# Patient Record
Sex: Male | Born: 1935 | Race: White | Hispanic: No | Marital: Married | Smoking: Former smoker
Health system: Southern US, Community
[De-identification: ages and names within clinical notes are randomized; demographics above are authoritative.]

## PROBLEM LIST (undated history)

## (undated) DIAGNOSIS — J189 Pneumonia, unspecified organism: Secondary | ICD-10-CM

## (undated) DIAGNOSIS — I10 Essential (primary) hypertension: Secondary | ICD-10-CM

## (undated) DIAGNOSIS — I2510 Atherosclerotic heart disease of native coronary artery without angina pectoris: Secondary | ICD-10-CM

## (undated) DIAGNOSIS — I509 Heart failure, unspecified: Secondary | ICD-10-CM

## (undated) DIAGNOSIS — N289 Disorder of kidney and ureter, unspecified: Secondary | ICD-10-CM

## (undated) DIAGNOSIS — E7800 Pure hypercholesterolemia, unspecified: Secondary | ICD-10-CM

## (undated) DIAGNOSIS — K2270 Barrett's esophagus without dysplasia: Secondary | ICD-10-CM

## (undated) DIAGNOSIS — I219 Acute myocardial infarction, unspecified: Secondary | ICD-10-CM

## (undated) HISTORY — PX: CORONARY ANGIOPLASTY: SHX604

## (undated) HISTORY — PX: HERNIA REPAIR: SHX51

---

## 1998-02-03 ENCOUNTER — Inpatient Hospital Stay (HOSPITAL_COMMUNITY): Admission: EM | Admit: 1998-02-03 | Discharge: 1998-02-04 | Payer: Self-pay | Admitting: Emergency Medicine

## 1998-08-06 ENCOUNTER — Emergency Department (HOSPITAL_COMMUNITY): Admission: EM | Admit: 1998-08-06 | Discharge: 1998-08-06 | Payer: Self-pay | Admitting: Emergency Medicine

## 1998-08-06 ENCOUNTER — Encounter: Payer: Self-pay | Admitting: Emergency Medicine

## 2003-07-31 ENCOUNTER — Ambulatory Visit (HOSPITAL_COMMUNITY): Admission: RE | Admit: 2003-07-31 | Discharge: 2003-07-31 | Payer: Self-pay | Admitting: *Deleted

## 2003-07-31 ENCOUNTER — Encounter (INDEPENDENT_AMBULATORY_CARE_PROVIDER_SITE_OTHER): Payer: Self-pay | Admitting: Specialist

## 2004-11-07 ENCOUNTER — Ambulatory Visit (HOSPITAL_COMMUNITY): Admission: RE | Admit: 2004-11-07 | Discharge: 2004-11-07 | Payer: Self-pay | Admitting: *Deleted

## 2004-11-07 ENCOUNTER — Encounter (INDEPENDENT_AMBULATORY_CARE_PROVIDER_SITE_OTHER): Payer: Self-pay | Admitting: Specialist

## 2005-08-26 ENCOUNTER — Encounter: Payer: Self-pay | Admitting: Cardiovascular Disease

## 2006-12-31 ENCOUNTER — Encounter: Admission: RE | Admit: 2006-12-31 | Discharge: 2006-12-31 | Payer: Self-pay | Admitting: Internal Medicine

## 2007-03-01 ENCOUNTER — Encounter (INDEPENDENT_AMBULATORY_CARE_PROVIDER_SITE_OTHER): Payer: Self-pay | Admitting: *Deleted

## 2007-03-01 ENCOUNTER — Ambulatory Visit (HOSPITAL_COMMUNITY): Admission: RE | Admit: 2007-03-01 | Discharge: 2007-03-01 | Payer: Self-pay | Admitting: *Deleted

## 2010-09-09 NOTE — Op Note (Signed)
NAMERIKKI, SMESTAD NO.:  [id number]   MEDICAL RECORD NO.:  [id number]          PATIENT TYPE:  AMB   LOCATION:  ENDO                         FACILITY:  [facility]   PHYSICIAN:  Georgiana Spinner, M.D.    DATE OF BIRTH:  [birth year]   DATE OF PROCEDURE:  03/01/2007  DATE OF DISCHARGE:                               OPERATIVE REPORT   PROCEDURE:  Upper endoscopy.   INDICATIONS:  Gastroesophageal reflux disease with a question of  Barrett's esophagus.   ANESTHESIA:  Fentanyl 50 mcg, Versed 4 mg.   DESCRIPTION OF PROCEDURE:  With the patient mildly sedated in the left  lateral decubitus position, the Pentax videoscopic endoscope was  inserted in the mouth and passed under direct vision through the  esophagus which appeared normal until we reached the distal esophagus  and there appeared to be an isthmus of Barrett's photographed and  biopsied.  We entered into the stomach.  The fundus and body appeared  normal.  The prepyloric area, however, showed changes of erythema,  probably a localized gastritis which was photographed and again  biopsied.  We entered into the duodenal bulb and second portion of the  duodenum, both appeared normal.  From this point, the endoscope was  slowly withdrawn taking circumferential views of the duodenal mucosa  until the endoscope had been pulled back into stomach and placed in  retroflexion to view the stomach from below. The endoscope was  straightened and withdrawn taking circumferential views of the remaining  gastric and esophageal mucosa.  The patient's vital signs and pulse  oximeter remained stable.  The patient tolerated the procedure well  without apparent complications   FINDINGS:  Erythematous prepyloric area biopsied and question of  Barrett's esophagus biopsied.  Await biopsy report.  The patient will  call me for results and follow up with me as an outpatient.           ______________________________  Georgiana Spinner,  M.D.     GMO/MEDQ  D:  03/01/2007  T:  03/01/2007  Job:  [id number]

## 2010-09-12 NOTE — Op Note (Signed)
NAMEDREVON, PLOG NO.:  [id number]   MEDICAL RECORD NO.:  [id number]          PATIENT TYPE:  AMB   LOCATION:  ENDO                         FACILITY:  [facility]   PHYSICIAN:  Georgiana Spinner, M.D.    DATE OF BIRTH:  March 01, 1929   DATE OF PROCEDURE:  11/07/2004  DATE OF DISCHARGE:                                 OPERATIVE REPORT   PROCEDURE:  Upper endoscopy with biopsy.   INDICATIONS:  Barrett's esophagus by biopsy in the past.   ANESTHESIA:  Demerol 40, Versed 4 mg.   DESCRIPTION OF PROCEDURE:  With the patient mildly sedated in the left  lateral decubitus position, the Olympus videoscopic endoscope was inserted  in the mouth and passed under direct vision into the esophagus which  appeared normal until we reached the distal esophagus and it appeared he had  a rolled stricture of the distal esophagus. This was photographed and he had  an area of Barrett's photographed as well and biopsied. We entered into the  stomach. The fundus, body, antrum, duodenal bulb, second portion of duodenum  all appeared normal. From this point, the endoscope was slowly withdrawn  taking circumferential views of the duodenal mucosa until the endoscope was  then pulled back in the stomach, placed in retroflexion to view the stomach  from below. The endoscope was straightened and withdrawn taking  circumferential views of the remaining gastric and esophageal mucosa  stopping in the antrum to biopsy an erythematous area. The patient's vital  signs, pulse oximeter remained stable. The patient tolerated procedure well  without apparent complications.   FINDINGS:  Erythema of the antrum biopsied, incomplete wrap of the GE  junction around the endoscope indicating laxity of the gastroesophageal  sphincter, Barrett's esophagus and question of early stricture.   PLAN:  Await biopsy report. The patient will call me for results and follow-  up with me as an outpatient       GMO/MEDQ  D:   11/07/2004  T:  11/07/2004  Job:  [id number]

## 2010-09-12 NOTE — Op Note (Signed)
NAME:  Kirk Townsend, OU NO.:  [id number]   MEDICAL RECORD NO.:  [id number]                   PATIENT TYPE:  AMB   LOCATION:  ENDO                                 FACILITY:  [facility]   PHYSICIAN:  Georgiana Spinner, M.D.                 DATE OF BIRTH:  [birth year]   DATE OF PROCEDURE:  07/31/2003  DATE OF DISCHARGE:                                 OPERATIVE REPORT   PROCEDURE PERFORMED:  Colonoscopy.   ENDOSCOPIST:  Georgiana Spinner, M.D.   INDICATIONS FOR PROCEDURE:  Colon cancer screening.   ANESTHESIA:  None further given.   DESCRIPTION OF PROCEDURE:  With the patient mildly sedated in the left  lateral decubitus position, the Olympus video colonoscope was inserted in  the rectum and passed under direct vision to the cecum, identified by the  ileocecal valve and appendiceal orifice, both of which were photographed.  From this point the colonoscope was slowly withdrawn taking circumferential  views of the colonic mucosa stopping in the rectum which appeared normal on  direct and retroflex view.  The endoscope was straightened and withdrawn.  The patient's vital signs and pulse oximeter remained stable.  The patient  tolerated the procedure well without apparent complications.   FINDINGS:  Unremarkable examination.   PLAN:  See endoscopy note for further details.                                               Georgiana Spinner, M.D.    GMO/MEDQ  D:  07/31/2003  T:  07/31/2003  Job:  [id number]

## 2010-09-12 NOTE — Op Note (Signed)
NAME:  Kirk Townsend, Kirk Townsend NO.:  [id number]   MEDICAL RECORD NO.:  [id number]                   PATIENT TYPE:  AMB   LOCATION:  ENDO                                 FACILITY:  [facility]   PHYSICIAN:  Georgiana Spinner, M.D.                 DATE OF BIRTH:  [birth year]   DATE OF PROCEDURE:  07/31/2003  DATE OF DISCHARGE:                                 OPERATIVE REPORT   PROCEDURE:  Upper endoscopy.   INDICATIONS FOR PROCEDURE:  GERD.   ANESTHESIA:  Demerol 40, Versed 4 mg.   PROCEDURE:  With the patient mildly sedated in the left lateral decubitus  position, the Olympus videoscopic endoscope was inserted in the mouth and  passed under direct vision through the esophagus.  I did not see evidence of  Barrett's although the distal esophagus was somewhat inflamed.  This was  photographed and subsequently biopsied.  We entered into the stomach.  The  fundus, body, antrum, duodenal bulb, and second portion of the duodenum were  visualized.  From this point, the endoscope was slowly withdrawn taking  circumferential views of the duodenal mucosa until the endoscope was pulled  back into the stomach and placed in retroflexion to view the stomach from  below and a loose wrap of the GE junction was seen and photographed.  The  endoscope was then straightened and withdrawn taking circumferential views  of the remaining gastric and esophageal mucosa stopping at the body of the  stomach which appeared mottled and was photographed and biopsied.  The  endoscope was then withdrawn.  The patient's vital signs and pulse oximetry  remained stable.  The patient tolerated the procedure well without apparent  complications.   FINDINGS:  Changes of the distal esophagus, biopsied, and biopsy of the  stomach body.   PLAN:  Await biopsy reports, patient will call me for results and follow up  as an outpatient.  Proceed with colonoscopy as planned.             Georgiana Spinner, M.D.    GMO/MEDQ  D:  07/31/2003  T:  07/31/2003  Job:  [id number]

## 2011-05-26 DIAGNOSIS — I10 Essential (primary) hypertension: Secondary | ICD-10-CM | POA: Diagnosis not present

## 2011-05-26 DIAGNOSIS — N289 Disorder of kidney and ureter, unspecified: Secondary | ICD-10-CM | POA: Diagnosis not present

## 2011-06-02 DIAGNOSIS — G47419 Narcolepsy without cataplexy: Secondary | ICD-10-CM | POA: Diagnosis not present

## 2011-06-02 DIAGNOSIS — G4710 Hypersomnia, unspecified: Secondary | ICD-10-CM | POA: Diagnosis not present

## 2011-06-02 DIAGNOSIS — I6529 Occlusion and stenosis of unspecified carotid artery: Secondary | ICD-10-CM | POA: Diagnosis not present

## 2011-06-02 DIAGNOSIS — H4900 Third [oculomotor] nerve palsy, unspecified eye: Secondary | ICD-10-CM | POA: Diagnosis not present

## 2011-06-19 DIAGNOSIS — N2581 Secondary hyperparathyroidism of renal origin: Secondary | ICD-10-CM | POA: Diagnosis not present

## 2011-06-19 DIAGNOSIS — D649 Anemia, unspecified: Secondary | ICD-10-CM | POA: Diagnosis not present

## 2011-06-19 DIAGNOSIS — I129 Hypertensive chronic kidney disease with stage 1 through stage 4 chronic kidney disease, or unspecified chronic kidney disease: Secondary | ICD-10-CM | POA: Diagnosis not present

## 2011-06-19 DIAGNOSIS — N183 Chronic kidney disease, stage 3 unspecified: Secondary | ICD-10-CM | POA: Diagnosis not present

## 2011-06-24 DIAGNOSIS — I10 Essential (primary) hypertension: Secondary | ICD-10-CM | POA: Diagnosis not present

## 2011-07-06 DIAGNOSIS — I10 Essential (primary) hypertension: Secondary | ICD-10-CM | POA: Diagnosis not present

## 2011-08-05 DIAGNOSIS — I10 Essential (primary) hypertension: Secondary | ICD-10-CM | POA: Diagnosis not present

## 2011-08-05 DIAGNOSIS — N289 Disorder of kidney and ureter, unspecified: Secondary | ICD-10-CM | POA: Diagnosis not present

## 2011-08-14 DIAGNOSIS — N179 Acute kidney failure, unspecified: Secondary | ICD-10-CM | POA: Diagnosis not present

## 2011-08-19 DIAGNOSIS — I129 Hypertensive chronic kidney disease with stage 1 through stage 4 chronic kidney disease, or unspecified chronic kidney disease: Secondary | ICD-10-CM | POA: Diagnosis not present

## 2011-08-19 DIAGNOSIS — R609 Edema, unspecified: Secondary | ICD-10-CM | POA: Diagnosis not present

## 2011-08-20 DIAGNOSIS — N289 Disorder of kidney and ureter, unspecified: Secondary | ICD-10-CM | POA: Diagnosis not present

## 2011-08-24 DIAGNOSIS — N390 Urinary tract infection, site not specified: Secondary | ICD-10-CM | POA: Diagnosis not present

## 2011-08-24 DIAGNOSIS — I10 Essential (primary) hypertension: Secondary | ICD-10-CM | POA: Diagnosis not present

## 2011-08-24 DIAGNOSIS — R609 Edema, unspecified: Secondary | ICD-10-CM | POA: Diagnosis not present

## 2011-09-01 ENCOUNTER — Emergency Department (HOSPITAL_COMMUNITY): Payer: Medicare Other

## 2011-09-01 ENCOUNTER — Encounter (HOSPITAL_COMMUNITY): Payer: Self-pay

## 2011-09-01 ENCOUNTER — Inpatient Hospital Stay (HOSPITAL_COMMUNITY)
Admission: EM | Admit: 2011-09-01 | Discharge: 2011-09-03 | DRG: 293 | Disposition: A | Discharge status: Home / Self Care | Payer: Medicare Other | Attending: Internal Medicine | Admitting: Internal Medicine

## 2011-09-01 DIAGNOSIS — I10 Essential (primary) hypertension: Secondary | ICD-10-CM | POA: Diagnosis present

## 2011-09-01 DIAGNOSIS — R609 Edema, unspecified: Secondary | ICD-10-CM | POA: Diagnosis not present

## 2011-09-01 DIAGNOSIS — J90 Pleural effusion, not elsewhere classified: Secondary | ICD-10-CM | POA: Diagnosis not present

## 2011-09-01 DIAGNOSIS — R0602 Shortness of breath: Secondary | ICD-10-CM | POA: Diagnosis not present

## 2011-09-01 DIAGNOSIS — M25579 Pain in unspecified ankle and joints of unspecified foot: Secondary | ICD-10-CM | POA: Diagnosis not present

## 2011-09-01 DIAGNOSIS — I129 Hypertensive chronic kidney disease with stage 1 through stage 4 chronic kidney disease, or unspecified chronic kidney disease: Secondary | ICD-10-CM | POA: Diagnosis present

## 2011-09-01 DIAGNOSIS — N189 Chronic kidney disease, unspecified: Secondary | ICD-10-CM

## 2011-09-01 DIAGNOSIS — J449 Chronic obstructive pulmonary disease, unspecified: Secondary | ICD-10-CM | POA: Diagnosis not present

## 2011-09-01 DIAGNOSIS — R918 Other nonspecific abnormal finding of lung field: Secondary | ICD-10-CM | POA: Diagnosis not present

## 2011-09-01 DIAGNOSIS — G47419 Narcolepsy without cataplexy: Secondary | ICD-10-CM | POA: Diagnosis present

## 2011-09-01 DIAGNOSIS — I2510 Atherosclerotic heart disease of native coronary artery without angina pectoris: Secondary | ICD-10-CM | POA: Diagnosis present

## 2011-09-01 DIAGNOSIS — I5031 Acute diastolic (congestive) heart failure: Principal | ICD-10-CM | POA: Diagnosis present

## 2011-09-01 DIAGNOSIS — M79609 Pain in unspecified limb: Secondary | ICD-10-CM | POA: Diagnosis not present

## 2011-09-01 DIAGNOSIS — M19079 Primary osteoarthritis, unspecified ankle and foot: Secondary | ICD-10-CM | POA: Diagnosis not present

## 2011-09-01 DIAGNOSIS — I509 Heart failure, unspecified: Secondary | ICD-10-CM

## 2011-09-01 DIAGNOSIS — J438 Other emphysema: Secondary | ICD-10-CM | POA: Diagnosis not present

## 2011-09-01 DIAGNOSIS — I501 Left ventricular failure: Secondary | ICD-10-CM | POA: Diagnosis not present

## 2011-09-01 DIAGNOSIS — R6889 Other general symptoms and signs: Secondary | ICD-10-CM | POA: Diagnosis not present

## 2011-09-01 DIAGNOSIS — R062 Wheezing: Secondary | ICD-10-CM | POA: Diagnosis not present

## 2011-09-01 DIAGNOSIS — M7989 Other specified soft tissue disorders: Secondary | ICD-10-CM | POA: Diagnosis not present

## 2011-09-01 HISTORY — DX: Atherosclerotic heart disease of native coronary artery without angina pectoris: I25.10

## 2011-09-01 HISTORY — DX: Essential (primary) hypertension: I10

## 2011-09-01 HISTORY — DX: Pure hypercholesterolemia, unspecified: E78.00

## 2011-09-01 HISTORY — DX: Acute myocardial infarction, unspecified: I21.9

## 2011-09-01 HISTORY — DX: Barrett's esophagus without dysplasia: K22.70

## 2011-09-01 HISTORY — DX: Disorder of kidney and ureter, unspecified: N28.9

## 2011-09-01 LAB — BASIC METABOLIC PANEL
BUN: 18 mg/dL (ref 6–23)
CO2: 25 mEq/L (ref 19–32)
Chloride: 102 mEq/L (ref 96–112)
Creatinine, Ser: 1.44 mg/dL — ABNORMAL HIGH (ref 0.50–1.35)
Potassium: 4.1 mEq/L (ref 3.5–5.1)

## 2011-09-01 LAB — CBC
HCT: 41.2 % (ref 39.0–52.0)
MCV: 95.2 fL (ref 78.0–100.0)
Platelets: 252 10*3/uL (ref 150–400)
RBC: 4.33 MIL/uL (ref 4.22–5.81)
WBC: 8.5 10*3/uL (ref 4.0–10.5)

## 2011-09-01 LAB — TROPONIN I: Troponin I: <0.3 ng/mL (ref <0.30)

## 2011-09-01 LAB — URINALYSIS, ROUTINE W REFLEX MICROSCOPIC
Hgb urine dipstick: NEGATIVE
Leukocytes, UA: NEGATIVE
Nitrite: NEGATIVE
Protein, ur: NEGATIVE mg/dL
Specific Gravity, Urine: 1.014 (ref 1.005–1.030)
Urobilinogen, UA: 1 mg/dL (ref 0.0–1.0)

## 2011-09-01 LAB — POCT I-STAT TROPONIN I: Troponin i, poc: 0.01 ng/mL (ref 0.00–0.08)

## 2011-09-01 LAB — CARDIAC PANEL(CRET KIN+CKTOT+MB+TROPI)
CK, MB: 1.5 ng/mL (ref 0.3–4.0)
Total CK: 46 U/L (ref 7–232)

## 2011-09-01 LAB — HEPATIC FUNCTION PANEL
AST: 16 U/L (ref 0–37)
Bilirubin, Direct: 0.1 mg/dL (ref 0.0–0.3)
Indirect Bilirubin: 0.5 mg/dL (ref 0.3–0.9)
Total Bilirubin: 0.6 mg/dL (ref 0.3–1.2)

## 2011-09-01 MED ORDER — ACETAMINOPHEN 325 MG PO TABS: 650.0000 mg | ORAL_TABLET | ORAL | Status: DC | PRN

## 2011-09-01 MED ORDER — AMPHETAMINE-DEXTROAMPHETAMINE 10 MG PO TABS
10.0000 mg | ORAL_TABLET | Freq: Two times a day (BID) | ORAL | Status: DC
  Administered 2011-09-02 – 2011-09-03 (×2): 10 mg via ORAL
  Filled 2011-09-01 (×3): qty 1

## 2011-09-01 MED ORDER — ADULT MULTIVITAMIN W/MINERALS CH
1.0000 | ORAL_TABLET | Freq: Every day | ORAL | Status: DC
  Administered 2011-09-02 – 2011-09-03 (×2): 1 via ORAL
  Filled 2011-09-01 (×2): qty 1

## 2011-09-01 MED ORDER — SODIUM CHLORIDE 0.9 % IJ SOLN
3.0000 mL | Freq: Two times a day (BID) | INTRAMUSCULAR | Status: DC
  Administered 2011-09-01 – 2011-09-03 (×4): 3 mL via INTRAVENOUS

## 2011-09-01 MED ORDER — FUROSEMIDE 10 MG/ML IJ SOLN
40.0000 mg | Freq: Two times a day (BID) | INTRAMUSCULAR | Status: DC
  Administered 2011-09-02 – 2011-09-03 (×3): 40 mg via INTRAVENOUS
  Filled 2011-09-01 (×5): qty 4

## 2011-09-01 MED ORDER — FUROSEMIDE 10 MG/ML IJ SOLN
80.0000 mg | Freq: Once | INTRAMUSCULAR | Status: AC
  Administered 2011-09-01: 80 mg via INTRAVENOUS
  Filled 2011-09-01: qty 8

## 2011-09-01 MED ORDER — PANTOPRAZOLE SODIUM 40 MG PO TBEC
40.0000 mg | DELAYED_RELEASE_TABLET | Freq: Every day | ORAL | Status: DC
  Administered 2011-09-02 – 2011-09-03 (×2): 40 mg via ORAL
  Filled 2011-09-01 (×2): qty 1

## 2011-09-01 MED ORDER — ONDANSETRON HCL 4 MG/2ML IJ SOLN: 4.0000 mg | Freq: Four times a day (QID) | INTRAMUSCULAR | Status: DC | PRN

## 2011-09-01 MED ORDER — ENOXAPARIN SODIUM 40 MG/0.4ML ~~LOC~~ SOLN
40.0000 mg | SUBCUTANEOUS | Status: DC
  Administered 2011-09-01 – 2011-09-02 (×2): 40 mg via SUBCUTANEOUS
  Filled 2011-09-01 (×3): qty 0.4

## 2011-09-01 MED ORDER — ASPIRIN EC 81 MG PO TBEC
81.0000 mg | DELAYED_RELEASE_TABLET | Freq: Every day | ORAL | Status: DC
  Administered 2011-09-01 – 2011-09-03 (×3): 81 mg via ORAL
  Filled 2011-09-01 (×3): qty 1

## 2011-09-01 MED ORDER — POTASSIUM CHLORIDE CRYS ER 20 MEQ PO TBCR
20.0000 meq | EXTENDED_RELEASE_TABLET | Freq: Every day | ORAL | Status: DC
  Administered 2011-09-02 – 2011-09-03 (×2): 20 meq via ORAL
  Filled 2011-09-01 (×2): qty 1

## 2011-09-01 MED ORDER — SODIUM CHLORIDE 0.9 % IJ SOLN: 3.0000 mL | INTRAMUSCULAR | Status: DC | PRN

## 2011-09-01 MED ORDER — SIMVASTATIN 20 MG PO TABS
20.0000 mg | ORAL_TABLET | Freq: Every evening | ORAL | Status: DC
  Administered 2011-09-01 – 2011-09-02 (×2): 20 mg via ORAL
  Filled 2011-09-01 (×3): qty 1

## 2011-09-01 MED ORDER — BISOPROLOL FUMARATE 5 MG PO TABS
5.0000 mg | ORAL_TABLET | Freq: Every day | ORAL | Status: DC
  Administered 2011-09-02 – 2011-09-03 (×2): 5 mg via ORAL
  Filled 2011-09-01 (×2): qty 1

## 2011-09-01 MED ORDER — SODIUM CHLORIDE 0.9 % IV SOLN: 250.0000 mL | INTRAVENOUS | Status: DC | PRN

## 2011-09-01 MED ORDER — ARMODAFINIL 250 MG PO TABS: 250.0000 mg | ORAL_TABLET | Freq: Every day | ORAL | Status: DC

## 2011-09-01 NOTE — ED Provider Notes (Signed)
History     CSN: [id number]  Arrival date & time 09/01/11  1315   First MD Initiated Contact with Patient 09/01/11 1547      Chief Complaint  Patient presents with  . Shortness of Breath    (Consider location/radiation/quality/duration/timing/severity/associated sxs/prior treatment) HPI Patient is an 78 year old male with history of heart failure who presents today based on referral from his primary care physician, Dr. Rayfield Citizen.  Patient had his Lasix discontinued 2 days ago due to concerns over renal insufficiency. Patient has been receiving treatment with torsemide sent April 29. Patient has a history of coronary artery disease but last cardiac catheterization was in 1994. This was performed by Dr. Aleen Campi of [facility]. The patient has not seen a cardiologist since the mid 90s. He denies any chest pain. He does endorse bilateral lower extremity edema as well as dyspnea on exertion. Patient has JVD as well as bilateral lower extremity pitting edema. He is hemodynamically stable on presentation. Denies recent cough, sick contacts, or fever. Patient reports that his symptoms began when he discontinued his Lasix. He's had no chest pain throughout this.There are no other associated or modifying factors.  Past Medical History  Diagnosis Date  . Coronary artery disease   . Hypertension   . Renal disorder   . High cholesterol   . Narcolepsy   . Myocardial infarction     1994  . Barrett's esophagus     Past Surgical History  Procedure Date  . Hernia repair   . Coronary angioplasty     1994. No stent.    History reviewed. No pertinent family history.  History  Substance Use Topics  . Smoking status: Former Smoker -- 20 years    Quit date: 08/31/1956  . Smokeless tobacco: Never Used  . Alcohol Use: No      Review of Systems  Constitutional: Negative.   HENT: Negative.   Eyes: Negative.   Respiratory: Positive for shortness of breath.   Cardiovascular:  Positive for leg swelling.  Gastrointestinal: Negative.   Genitourinary: Negative.   Musculoskeletal: Negative.   Skin: Negative.   Neurological: Negative.   Hematological: Negative.   Psychiatric/Behavioral: Negative.   All other systems reviewed and are negative.    Allergies  Review of patient's allergies indicates no known allergies.  Home Medications   Current Outpatient Rx  Name Route Sig Dispense Refill  . AMLODIPINE BESYLATE 5 MG PO TABS Oral Take 5 mg by mouth daily.    . AMPHETAMINE-DEXTROAMPHETAMINE 10 MG PO TABS Oral Take 10 mg by mouth 2 (two) times daily.    . ARMODAFINIL 250 MG PO TABS Oral Take 250 mg by mouth daily.    . ASPIRIN 81 MG PO CHEW Oral Chew 81 mg by mouth daily.    Marland Kitchen BISOPROLOL FUMARATE 5 MG PO TABS Oral Take 5 mg by mouth daily.    . ADULT MULTIVITAMIN W/MINERALS CH Oral Take 1 tablet by mouth daily.    Marland Kitchen OMEPRAZOLE 20 MG PO CPDR Oral Take 20 mg by mouth daily.    Marland Kitchen SIMVASTATIN 20 MG PO TABS Oral Take 20 mg by mouth every evening.    Percell Boston PO Oral Take 4 capsules by mouth 2 (two) times daily.    . TORSEMIDE 5 MG PO TABS Oral Take 5 mg by mouth daily.    Marland Kitchen VITAMIN C 500 MG PO TABS Oral Take 500 mg by mouth daily.      BP 153/69  Pulse 65  Temp(Src) 98.9 F (37.2 C) (Oral)  Resp 16  SpO2 99%  Physical Exam  Nursing note and vitals reviewed. GEN: Well-developed, well-nourished male in no distress HEENT: Atraumatic, normocephalic. Oropharynx clear without erythema EYES: PERRLA BL, no scleral icterus. NECK: Trachea midline, no meningismus, JVD noted CV: regular rate and rhythm. No murmurs, rubs, or gallops PULM: No respiratory distress.  Diminished at the bases bilaterally. No crackles wheezes or rales. GI: soft, non-tender. No guarding, rebound, or tenderness. + bowel sounds  GU: deferred Neuro: cranial nerves 2-12 intact, no abnormalities of strength or sensation, A and O x 3 MSK: Patient moves all 4 extremities symmetrically, no  deformity,or injury noted. 2+ pitting edema bilaterally below the knees  Skin: No rashes petechiae, purpura, or jaundice Psych: no abnormality of mood   ED Course  Procedures (including critical care time)   Date: 09/01/2011  Rate: 63  Rhythm: normal sinus rhythm and premature ventricular contractions (PVC)  QRS Axis: normal  Intervals: normal  ST/T Wave abnormalities: normal  Conduction Disutrbances:none  Narrative Interpretation:   Old EKG Reviewed: unchanged   Labs Reviewed  BASIC METABOLIC PANEL - Abnormal; Notable for the following:    Creatinine, Ser 1.44 (*)    GFR calc non Af Amer 44 (*)    GFR calc Af Amer 51 (*)    All other components within normal limits  PRO B NATRIURETIC PEPTIDE - Abnormal; Notable for the following:    Pro B Natriuretic peptide (BNP) 2960.0 (*)    All other components within normal limits  URINALYSIS, ROUTINE W REFLEX MICROSCOPIC - Abnormal; Notable for the following:    Ketones, ur TRACE (*)    All other components within normal limits  CBC  TROPONIN I  POCT I-STAT TROPONIN I  HEPATIC FUNCTION PANEL   Dg Chest 2 View  09/01/2011  *RADIOLOGY REPORT*  Clinical Data: Shortness of breath, former smoker, weakness, hypertension, coronary artery disease  CHEST - 2 VIEW  Comparison: 09/01/2011  Findings: Enlargement of cardiac silhouette. Pulmonary vascular congestion. Atherosclerotic calcification of mildly tortuous thoracic aorta. Mild chronic interstitial prominence with new bibasilar infiltrates which could reflect edema or infection. Small bibasilar pleural effusions. Underlying emphysematous changes without pneumothorax. Multiple old left rib fractures. Osseous demineralization.  IMPRESSION: Enlargement cardiac silhouette with pulmonary vascular congestion and new basilar infiltrates with associated pleural effusions, question mild CHF versus infection. Underlying emphysematous and chronic interstitial lung disease changes.  Original Report  Authenticated By: Lollie Marrow, M.D.     1. Acute heart failure       MDM  Patient presented with JVD and bilateral lower chimney edema concerning for heart failure exacerbation. This developed incontinence or with discontinuation patient's Lasix. Patient was hemodynamically stable here. Patient was given a dose of 80 mg of Lasix IV. This was after his creatinine was noted to be 1.4. Patient was chest pain-free throughout his time in the emergency department. I did have results from chest x-ray taken at 11 AM and this showed only a small left-sided pleural effusion. Chest x-ray here was concerning for worsening of this. Patient will be admitted to the hospitalist for further management.        Cyndra Numbers, MD 09/02/11 [phone]

## 2011-09-01 NOTE — ED Notes (Signed)
Patient here from Doctors office due to increasing pedal edema and shortness of breath x 2 days. Patient off Lasix for the past few days due to creatinine level up. No chestpain

## 2011-09-01 NOTE — H&P (Signed)
History and Physical  Kirk Townsend VHQ:[id number] DOB: 1929-04-02 DOA: 09/01/2011  Referring physician: Judie Petit. Alto Denver, M.D. PCP: Londell Moh, MD, MD  Cardiologist: Has not seen one in many years. Nephrologist: [employer] Complaint: Shortness of breath  HPI:  78 year old man presented to the emergency department with two-day history of shortness of breath and orthopnea. Patient was in his usual state of health until yesterday he developed sudden shortness of breath in the evening. He was unable to sleep at night even though the head of his bed was greater than 45. No chest pain. Breathing difficulties persisted today and he presented to his primary care physician's office. He was subsequently transferred to the emergency department for further evaluation.  Patient has a history of coronary artery disease with angioplasty 1994 has had no problems since. He has been on Lasix for many years which until recently has controlled his edema well. He was recently seen by his nephrologist and Lasix was discontinued and torsemide was started. Not entirely clear how may days have elapsed since that but it was recently.  In the emergency department patient was noted to be afebrile and vital signs were stable with minimal hypoxia. Patient was given 80 mg IV Lasix. Chemistry panel was notable for a creatinine of 1.44 (no previous data points available for comparison). Troponin was negative. CBC unremarkable, urinalysis unremarkable. Chest x-ray suggested pulmonary edema.He was referred for admission for acute systolic congestive heart failure.  Review of Systems:  No fever, changes to his vision, sore throat, rash, new muscle aches, chest pain, dysuria, bleeding, nausea, vomiting, abdominal pain, diarrhea.  Past Medical History  Diagnosis Date  . Coronary artery disease   . Hypertension   . Renal disorder   . High cholesterol   . Narcolepsy   . Myocardial infarction     1994  . Barrett's  esophagus    Past Surgical History  Procedure Date  . Hernia repair   . Coronary angioplasty     1994. No stent.   Social History:  reports that he has quit smoking. He does not have any smokeless tobacco history on file. He reports that he does not drink alcohol or use illicit drugs.  No Known Allergies  History reviewed. No pertinent family history.Patient denies In any significant family medical problems.  Prior to Admission medications   Medication Sig Start Date End Date Taking? Authorizing Provider  amLODipine (NORVASC) 5 MG tablet Take 5 mg by mouth daily.   Yes Historical Provider, MD  amphetamine-dextroamphetamine (ADDERALL) 10 MG tablet Take 10 mg by mouth 2 (two) times daily.   Yes Historical Provider, MD  Armodafinil (NUVIGIL) 250 MG tablet Take 250 mg by mouth daily.   Yes Historical Provider, MD  aspirin 81 MG chewable tablet Chew 81 mg by mouth daily.   Yes Historical Provider, MD  bisoprolol (ZEBETA) 5 MG tablet Take 5 mg by mouth daily.   Yes Historical Provider, MD  Multiple Vitamin (MULITIVITAMIN WITH MINERALS) TABS Take 1 tablet by mouth daily.   Yes Historical Provider, MD  omeprazole (PRILOSEC) 20 MG capsule Take 20 mg by mouth daily.   Yes Historical Provider, MD  simvastatin (ZOCOR) 20 MG tablet Take 20 mg by mouth every evening.   Yes Historical Provider, MD  Sodium Oxybate (XYREM PO) Take 4 capsules by mouth 2 (two) times daily.   Yes Historical Provider, MD  torsemide (DEMADEX) 5 MG tablet Take 5 mg by mouth daily.   Yes Historical Provider, MD  vitamin C (ASCORBIC ACID) 500 MG tablet Take 500 mg by mouth daily.   Yes Historical Provider, MD   Physical Exam: Filed Vitals:   09/01/11 1320 09/01/11 1520  BP: 185/73 153/69  Pulse: 67 65  Temp: 98.9 F (37.2 C)   TempSrc: Oral   Resp: 15 16  SpO2: 97% 99%    General:  Appears calm and comfortable. Exam and in the emergency department. Sitting up on stretcher. Nontoxic.   Eyes: Pupils round and equal.  Lids and irises appear unremarkable.   ENT: Grossly normal hearing. Lips and tongue appear unremarkable. Fair dentition.   Neck: No lymphadenopathy or masses. No thyromegaly.   Cardiovascular: Regular rate and rhythm. No murmur, rub, gallop. 2+ bilateral lower extremity edema.   Respiratory: Clear to auscultation bilaterally (anteriorly). Posteriorly, bilateral rales noted. Normal work of breathing.   Abdomen: Soft, nontender, nondistended.   Skin: Appears grossly unremarkable.   Musculoskeletal: Appears grossly unremarkable.   Psychiatric: Grossly normal mood and affect. Speech fluent and appropriate.   Labs on Admission:  Basic Metabolic Panel:  Lab 09/01/11 1478  NA 139  K 4.1  CL 102  CO2 25  GLUCOSE 89  BUN 18  CREATININE 1.44*  CALCIUM 9.5  MG --  PHOS --   Liver Function Tests:  Lab 09/01/11 1549  AST 16  ALT 9  ALKPHOS 70  BILITOT 0.6  PROT 6.6  ALBUMIN 3.5   CBC:  Lab 09/01/11 1500  WBC 8.5  NEUTROABS --  HGB 14.0  HCT 41.2  MCV 95.2  PLT 252   Cardiac Enzymes:  Lab 09/01/11 1500  CKTOTAL --  CKMB --  CKMBINDEX --  TROPONINI <0.30   BNP:    Component Value Date/Time   PROBNP 2960.0* 09/01/2011 1500   Radiological Exams on Admission: Dg Chest 2 View  09/01/2011  *RADIOLOGY REPORT*  Clinical Data: Shortness of breath, former smoker, weakness, hypertension, coronary artery disease  CHEST - 2 VIEW  Comparison: 09/01/2011  Findings: Enlargement of cardiac silhouette. Pulmonary vascular congestion. Atherosclerotic calcification of mildly tortuous thoracic aorta. Mild chronic interstitial prominence with new bibasilar infiltrates which could reflect edema or infection. Small bibasilar pleural effusions. Underlying emphysematous changes without pneumothorax. Multiple old left rib fractures. Osseous demineralization.  IMPRESSION: Enlargement cardiac silhouette with pulmonary vascular congestion and new basilar infiltrates with associated pleural  effusions, question mild CHF versus infection. Underlying emphysematous and chronic interstitial lung disease changes.  Original Report Authenticated By: Lollie Marrow, M.D.   EKG: Independently reviewed. Sinus rhythm. Left atrial enlargement. Nonspecific ST changes. No acute changes.  Assessment/Plan 1. Acute congestive heart failure, NOS: No echocardiogram on file. Most likely secondary to recent discontinuation of Lasix and substitution of torsemide. IV Lasix. 2-D echocardiogram. No discrete chest pain but will rule out with serial cardiac enzymes. Discontinue Norvasc as it can cause peripheral edema. No ACE inhibitor for now secondary to renal insufficiency. 2. History coronary artery disease: Appears to be stable. No signs or symptoms to suggest acute coronary syndrome. Continue aspirin, beta blocker. 3. Chronic kidney disease: Stage unknown. Obtain records from PCP's office. Repeat basic metabolic panel in the morning. 4. Hypertension: Stable. Continue beta blocker. Norvasc discontinued secondary to peripheral edema. 5. Narcolepsy: Continue chronic medications.  Discussed with patient and wife at bedside. All questions answered to their apparent satisfaction.  Code Status: Full code  Family Communication: Chilton Greathouse (wife) [phone] Disposition Plan: Home when improved.  Brendia Sacks, MD  [facility] Pager [phone] 09/01/2011,  6:14 PM

## 2011-09-01 NOTE — ED Notes (Signed)
Patient transported to X-ray 

## 2011-09-02 DIAGNOSIS — I2510 Atherosclerotic heart disease of native coronary artery without angina pectoris: Secondary | ICD-10-CM

## 2011-09-02 DIAGNOSIS — I509 Heart failure, unspecified: Secondary | ICD-10-CM

## 2011-09-02 DIAGNOSIS — I10 Essential (primary) hypertension: Secondary | ICD-10-CM

## 2011-09-02 DIAGNOSIS — N189 Chronic kidney disease, unspecified: Secondary | ICD-10-CM

## 2011-09-02 LAB — CARDIAC PANEL(CRET KIN+CKTOT+MB+TROPI)
CK, MB: 1.5 ng/mL (ref 0.3–4.0)
CK, MB: 1.3 ng/mL (ref 0.3–4.0)
Relative Index: INVALID (ref 0.0–2.5)
Relative Index: INVALID (ref 0.0–2.5)
Total CK: 34 U/L (ref 7–232)
Total CK: 36 U/L (ref 7–232)

## 2011-09-02 LAB — BASIC METABOLIC PANEL
BUN: 26 mg/dL — ABNORMAL HIGH (ref 6–23)
Chloride: 101 mEq/L (ref 96–112)
Creatinine, Ser: 1.61 mg/dL — ABNORMAL HIGH (ref 0.50–1.35)
GFR calc Af Amer: 45 mL/min — ABNORMAL LOW (ref >90)
GFR calc non Af Amer: 38 mL/min — ABNORMAL LOW (ref >90)

## 2011-09-02 MED ORDER — AMLODIPINE BESYLATE 10 MG PO TABS
10.0000 mg | ORAL_TABLET | Freq: Every day | ORAL | Status: DC
  Administered 2011-09-02 – 2011-09-03 (×2): 10 mg via ORAL
  Filled 2011-09-02 (×2): qty 1

## 2011-09-02 MED ORDER — HYDRALAZINE HCL 20 MG/ML IJ SOLN: 5.0000 mg | INTRAMUSCULAR | Status: DC | PRN

## 2011-09-02 NOTE — Progress Notes (Signed)
Subjective: Shortness of breath improved  Objective: Vital signs in last 24 hours: Filed Vitals:   09/01/11 1320 09/01/11 1520 09/01/11 2103 09/02/11 0659  BP: 185/73 153/69 174/71 178/84  Pulse: 67 65 62 62  Temp: 98.9 F (37.2 C)  98.1 F (36.7 C) 97.4 F (36.3 C)  TempSrc: Oral  Oral Oral  Resp: 15 16 16 18   Height:   5\' 7"  (1.702 m)   Weight:   70.716 kg (155 lb 14.4 oz) 70.4 kg (155 lb 3.3 oz)  SpO2: 97% 99% 96% 96%    Intake/Output Summary (Last 24 hours) at 09/02/11 1312 Last data filed at 09/02/11 0900  Gross per 24 hour  Intake    240 ml  Output   2450 ml  Net  -2210 ml    Weight change:   GEN: Well-developed, well-nourished male in no distress  HEENT: Atraumatic, normocephalic. Oropharynx clear without erythema  EYES: PERRLA BL, no scleral icterus.  NECK: Trachea midline, no meningismus, JVD noted  CV: regular rate and rhythm. No murmurs, rubs, or gallops  PULM: No respiratory distress. Diminished at the bases bilaterally. No crackles wheezes or rales.  GI: soft, non-tender. No guarding, rebound, or tenderness. + bowel sounds  GU: deferred  Neuro: cranial nerves 2-12 intact, no abnormalities of strength or sensation, A and O x 3  MSK: Patient moves all 4 extremities symmetrically, no deformity,or injury noted. 2+ pitting edema bilaterally below the knees  Skin: No rashes petechiae, purpura, or jaundice  Psych: no abnormality of mood      Lab Results: Results for orders placed during the hospital encounter of 09/01/11 (from the past 24 hour(s))  CBC     Status: Normal   Collection Time   09/01/11  3:00 PM      Component Value Range   WBC 8.5  4.0 - 10.5 (K/uL)   RBC 4.33  4.22 - 5.81 (MIL/uL)   Hemoglobin 14.0  13.0 - 17.0 (g/dL)   HCT 16.1  09.6 - 04.5 (%)   MCV 95.2  78.0 - 100.0 (fL)   MCH 32.3  26.0 - 34.0 (pg)   MCHC 34.0  30.0 - 36.0 (g/dL)   RDW 40.9  81.1 - 91.4 (%)   Platelets 252  150 - 400 (K/uL)  BASIC METABOLIC PANEL     Status:  Abnormal   Collection Time   09/01/11  3:00 PM      Component Value Range   Sodium 139  135 - 145 (mEq/L)   Potassium 4.1  3.5 - 5.1 (mEq/L)   Chloride 102  96 - 112 (mEq/L)   CO2 25  19 - 32 (mEq/L)   Glucose, Bld 89  70 - 99 (mg/dL)   BUN 18  6 - 23 (mg/dL)   Creatinine, Ser 7.82 (*) 0.50 - 1.35 (mg/dL)   Calcium 9.5  8.4 - 95.6 (mg/dL)   GFR calc non Af Amer 44 (*) >90 (mL/min)   GFR calc Af Amer 51 (*) >90 (mL/min)  TROPONIN I     Status: Normal   Collection Time   09/01/11  3:00 PM      Component Value Range   Troponin I <0.30  <0.30 (ng/mL)  PRO B NATRIURETIC PEPTIDE     Status: Abnormal   Collection Time   09/01/11  3:00 PM      Component Value Range   Pro B Natriuretic peptide (BNP) 2960.0 (*) 0 - 450 (pg/mL)  HEPATIC FUNCTION PANEL  Status: Normal   Collection Time   09/01/11  3:49 PM      Component Value Range   Total Protein 6.6  6.0 - 8.3 (g/dL)   Albumin 3.5  3.5 - 5.2 (g/dL)   AST 16  0 - 37 (U/L)   ALT 9  0 - 53 (U/L)   Alkaline Phosphatase 70  39 - 117 (U/L)   Total Bilirubin 0.6  0.3 - 1.2 (mg/dL)   Bilirubin, Direct 0.1  0.0 - 0.3 (mg/dL)   Indirect Bilirubin 0.5  0.3 - 0.9 (mg/dL)  URINALYSIS, ROUTINE W REFLEX MICROSCOPIC     Status: Abnormal   Collection Time   09/01/11  4:05 PM      Component Value Range   Color, Urine YELLOW  YELLOW    APPearance CLEAR  CLEAR    Specific Gravity, Urine 1.014  1.005 - 1.030    pH 7.5  5.0 - 8.0    Glucose, UA NEGATIVE  NEGATIVE (mg/dL)   Hgb urine dipstick NEGATIVE  NEGATIVE    Bilirubin Urine NEGATIVE  NEGATIVE    Ketones, ur TRACE (*) NEGATIVE (mg/dL)   Protein, ur NEGATIVE  NEGATIVE (mg/dL)   Urobilinogen, UA 1.0  0.0 - 1.0 (mg/dL)   Nitrite NEGATIVE  NEGATIVE    Leukocytes, UA NEGATIVE  NEGATIVE   POCT I-STAT TROPONIN I     Status: Normal   Collection Time   09/01/11  4:09 PM      Component Value Range   Troponin i, poc 0.01  0.00 - 0.08 (ng/mL)   Comment 3           CARDIAC PANEL(CRET KIN+CKTOT+MB+TROPI)      Status: Normal   Collection Time   09/01/11  9:45 PM      Component Value Range   Total CK 46  7 - 232 (U/L)   CK, MB 1.5  0.3 - 4.0 (ng/mL)   Troponin I <0.30  <0.30 (ng/mL)   Relative Index RELATIVE INDEX IS INVALID  0.0 - 2.5   BASIC METABOLIC PANEL     Status: Abnormal   Collection Time   09/02/11  4:55 AM      Component Value Range   Sodium 137  135 - 145 (mEq/L)   Potassium 3.5  3.5 - 5.1 (mEq/L)   Chloride 101  96 - 112 (mEq/L)   CO2 25  19 - 32 (mEq/L)   Glucose, Bld 97  70 - 99 (mg/dL)   BUN 26 (*) 6 - 23 (mg/dL)   Creatinine, Ser 1.61 (*) 0.50 - 1.35 (mg/dL)   Calcium 8.9  8.4 - 09.6 (mg/dL)   GFR calc non Af Amer 38 (*) >90 (mL/min)   GFR calc Af Amer 45 (*) >90 (mL/min)  CARDIAC PANEL(CRET KIN+CKTOT+MB+TROPI)     Status: Normal   Collection Time   09/02/11  4:55 AM      Component Value Range   Total CK 34  7 - 232 (U/L)   CK, MB 1.5  0.3 - 4.0 (ng/mL)   Troponin I <0.30  <0.30 (ng/mL)   Relative Index RELATIVE INDEX IS INVALID  0.0 - 2.5      Micro: No results found for this or any previous visit (from the past 240 hour(s)).  Studies/Results: Dg Chest 2 View  09/01/2011  *RADIOLOGY REPORT*  Clinical Data: Shortness of breath, former smoker, weakness, hypertension, coronary artery disease  CHEST - 2 VIEW  Comparison: 09/01/2011  Findings: Enlargement of cardiac silhouette.  Pulmonary vascular congestion. Atherosclerotic calcification of mildly tortuous thoracic aorta. Mild chronic interstitial prominence with new bibasilar infiltrates which could reflect edema or infection. Small bibasilar pleural effusions. Underlying emphysematous changes without pneumothorax. Multiple old left rib fractures. Osseous demineralization.  IMPRESSION: Enlargement cardiac silhouette with pulmonary vascular congestion and new basilar infiltrates with associated pleural effusions, question mild CHF versus infection. Underlying emphysematous and chronic interstitial lung disease changes.  Original  Report Authenticated By: Lollie Marrow, M.D.    Medications: . Scheduled Meds:   . amphetamine-dextroamphetamine  10 mg Oral BID  . Armodafinil  250 mg Oral Q breakfast  . aspirin EC  81 mg Oral Daily  . bisoprolol  5 mg Oral Daily  . enoxaparin  40 mg Subcutaneous Q24H  . furosemide  40 mg Intravenous Q12H  . furosemide  80 mg Intravenous Once  . mulitivitamin with minerals  1 tablet Oral Daily  . pantoprazole  40 mg Oral Q1200  . potassium chloride  20 mEq Oral Daily  . simvastatin  20 mg Oral QPM  . sodium chloride  3 mL Intravenous Q12H   Continuous Infusions:  PRN Meds:.sodium chloride, acetaminophen, ondansetron (ZOFRAN) IV, sodium chloride   Assessment: Principal Problem:  *Acute CHF Active Problems:  CKD (chronic kidney disease)  CAD (coronary artery disease)  HTN (hypertension)  Narcolepsy   Plan: #1 Acute congestive heart failure, NOS: No echocardiogram on file. 2-D echo is pending at this time. Agree with resuming IV Lasix. Serial cardiac enzymes have been negative. Check TSH.  Marland Kitchen No ACE inhibitor for now secondary to renal insufficiency.   #2 History coronary artery disease: Appears to be stable. No signs or symptoms to suggest acute coronary syndrome. Continue aspirin, beta blocker. Awaiting 2-D echo  #3 Chronic kidney disease: Stage unknown. Medicorenal disease, doubt the patient has any obstruction, for completeness obtain a renal ultrasound Obtain records from PCP's office. Repeat basic metabolic panel in the morning.   #4 Hypertension: Still uncontrolled. Continue beta blocker. Will resume Norvasc, start when necessary hydralazine. Full code   LOS: 1 day   Shante Archambeault 09/02/2011, 1:12 PM

## 2011-09-02 NOTE — Progress Notes (Signed)
   CARE MANAGEMENT NOTE 09/02/2011  Patient:  Kirk Townsend, Kirk Townsend   Account Number:  [id number]  Date Initiated:  09/02/2011  Documentation initiated by:  Jiles Crocker  Subjective/Objective Assessment:   ADMITTED WITH SOB, CHF     Action/Plan:   PCP IS DR Renne Crigler; LIVES AT HOME WITH SPOUSE   Anticipated DC Date:  09/09/2011   Anticipated DC Plan:  HOME W HOME HEALTH SERVICES  In-house referral  NA      DC Planning Services  CM consult            Status of service:  In process, will continue to follow Medicare Important Message given?  NA - LOS <3 / Initial given by admissions (If response is "NO", the following Medicare IM given date fields will be blank)  Per UR Regulation:  Reviewed for med. necessity/level of care/duration of stay  Comments:  09/02/2011- B Arnell Mausolf RN, BSN, MHA

## 2011-09-03 DIAGNOSIS — I501 Left ventricular failure: Secondary | ICD-10-CM | POA: Diagnosis not present

## 2011-09-03 DIAGNOSIS — I10 Essential (primary) hypertension: Secondary | ICD-10-CM | POA: Diagnosis not present

## 2011-09-03 DIAGNOSIS — I509 Heart failure, unspecified: Secondary | ICD-10-CM

## 2011-09-03 DIAGNOSIS — I2510 Atherosclerotic heart disease of native coronary artery without angina pectoris: Secondary | ICD-10-CM | POA: Diagnosis not present

## 2011-09-03 DIAGNOSIS — N189 Chronic kidney disease, unspecified: Secondary | ICD-10-CM

## 2011-09-03 LAB — CBC
HCT: 40.9 % (ref 39.0–52.0)
MCHC: 34.5 g/dL (ref 30.0–36.0)
MCV: 94.9 fL (ref 78.0–100.0)
Platelets: 292 10*3/uL (ref 150–400)
RDW: 12.6 % (ref 11.5–15.5)
WBC: 9.1 10*3/uL (ref 4.0–10.5)

## 2011-09-03 LAB — BASIC METABOLIC PANEL
BUN: 28 mg/dL — ABNORMAL HIGH (ref 6–23)
CO2: 25 mEq/L (ref 19–32)
Calcium: 8.9 mg/dL (ref 8.4–10.5)
Chloride: 99 mEq/L (ref 96–112)
Creatinine, Ser: 1.58 mg/dL — ABNORMAL HIGH (ref 0.50–1.35)

## 2011-09-03 LAB — TSH: TSH: 1.568 u[IU]/mL (ref 0.350–4.500)

## 2011-09-03 MED ORDER — AMLODIPINE BESYLATE 5 MG PO TABS: 10.0000 mg | ORAL_TABLET | Freq: Every day | ORAL | Status: DC

## 2011-09-03 MED ORDER — FUROSEMIDE 20 MG PO TABS: 60.0000 mg | ORAL_TABLET | Freq: Two times a day (BID) | ORAL | Status: DC

## 2011-09-03 NOTE — Progress Notes (Signed)
*  PRELIMINARY RESULTS* Echocardiogram 2D Echocardiogram has been performed.  Kirk Townsend 09/03/2011, 11:31 AM

## 2011-09-03 NOTE — Progress Notes (Signed)
Patient discharge to home.  Wife at bedside.  Denies pain.  Trace edema on BLE.  Vitals stable.  Patient has all belongings.  Discussed discharge instructions.  Verbalized understanding.  No other concerns at this time.  Barrie Lyme 3:36 PM 09/03/2011

## 2011-09-03 NOTE — Discharge Summary (Signed)
Physician Discharge Summary  Kirk Townsend MRN: [id number] DOB/AGE: 10/10/1928 78 y.o.  PCP: Londell Moh, MD, MD   Admit date: 09/01/2011 Discharge date: 09/03/2011  Discharge Diagnoses:     *Acute CHF probably diastolic 2-D echo is pending,     CKD (chronic kidney disease)  CAD (coronary artery disease)  HTN (hypertension)  Narcolepsy   Medication List  As of 09/03/2011 12:57 PM   STOP taking these medications         torsemide 5 MG tablet         TAKE these medications         amLODipine 5 MG tablet   Commonly known as: NORVASC   Take 2 tablets (10 mg total) by mouth daily.      amphetamine-dextroamphetamine 10 MG tablet   Commonly known as: ADDERALL   Take 10 mg by mouth 2 (two) times daily.      aspirin 81 MG chewable tablet   Chew 81 mg by mouth daily.      bisoprolol 5 MG tablet   Commonly known as: ZEBETA   Take 5 mg by mouth daily.      furosemide 20 MG tablet   Commonly known as: LASIX   Take 3 tablets (60 mg total) by mouth 2 (two) times daily.      mulitivitamin with minerals Tabs   Take 1 tablet by mouth daily.      NUVIGIL 250 MG tablet   Generic drug: Armodafinil   Take 250 mg by mouth daily.      omeprazole 20 MG capsule   Commonly known as: PRILOSEC   Take 20 mg by mouth daily.      simvastatin 20 MG tablet   Commonly known as: ZOCOR   Take 20 mg by mouth every evening.      vitamin C 500 MG tablet   Commonly known as: ASCORBIC ACID   Take 500 mg by mouth daily.      XYREM PO   Take 4 capsules by mouth 2 (two) times daily.            Discharge Condition: Stable  Disposition:    Consults: None   Significant Diagnostic Studies: Dg Chest 2 View  09/01/2011  *RADIOLOGY REPORT*  Clinical Data: Shortness of breath, former smoker, weakness, hypertension, coronary artery disease  CHEST - 2 VIEW  Comparison: 09/01/2011  Findings: Enlargement of cardiac silhouette. Pulmonary vascular congestion. Atherosclerotic  calcification of mildly tortuous thoracic aorta. Mild chronic interstitial prominence with new bibasilar infiltrates which could reflect edema or infection. Small bibasilar pleural effusions. Underlying emphysematous changes without pneumothorax. Multiple old left rib fractures. Osseous demineralization.  IMPRESSION: Enlargement cardiac silhouette with pulmonary vascular congestion and new basilar infiltrates with associated pleural effusions, question mild CHF versus infection. Underlying emphysematous and chronic interstitial lung disease changes.  Original Report Authenticated By: Lollie Marrow, M.D.   2-D echo was done results pending   Microbiology: No results found for this or any previous visit (from the past 240 hour(s)).   Labs: Results for orders placed during the hospital encounter of 09/01/11 (from the past 48 hour(s))  CBC     Status: Normal   Collection Time   09/01/11  3:00 PM      Component Value Range Comment   WBC 8.5  4.0 - 10.5 (K/uL)    RBC 4.33  4.22 - 5.81 (MIL/uL)    Hemoglobin 14.0  13.0 - 17.0 (g/dL)    HCT  41.2  39.0 - 52.0 (%)    MCV 95.2  78.0 - 100.0 (fL)    MCH 32.3  26.0 - 34.0 (pg)    MCHC 34.0  30.0 - 36.0 (g/dL)    RDW 40.9  81.1 - 91.4 (%)    Platelets 252  150 - 400 (K/uL)   BASIC METABOLIC PANEL     Status: Abnormal   Collection Time   09/01/11  3:00 PM      Component Value Range Comment   Sodium 139  135 - 145 (mEq/L)    Potassium 4.1  3.5 - 5.1 (mEq/L)    Chloride 102  96 - 112 (mEq/L)    CO2 25  19 - 32 (mEq/L)    Glucose, Bld 89  70 - 99 (mg/dL)    BUN 18  6 - 23 (mg/dL)    Creatinine, Ser 7.82 (*) 0.50 - 1.35 (mg/dL)    Calcium 9.5  8.4 - 10.5 (mg/dL)    GFR calc non Af Amer 44 (*) >90 (mL/min)    GFR calc Af Amer 51 (*) >90 (mL/min)   TROPONIN I     Status: Normal   Collection Time   09/01/11  3:00 PM      Component Value Range Comment   Troponin I <0.30  <0.30 (ng/mL)   PRO B NATRIURETIC PEPTIDE     Status: Abnormal   Collection Time    09/01/11  3:00 PM      Component Value Range Comment   Pro B Natriuretic peptide (BNP) 2960.0 (*) 0 - 450 (pg/mL)   HEPATIC FUNCTION PANEL     Status: Normal   Collection Time   09/01/11  3:49 PM      Component Value Range Comment   Total Protein 6.6  6.0 - 8.3 (g/dL)    Albumin 3.5  3.5 - 5.2 (g/dL)    AST 16  0 - 37 (U/L)    ALT 9  0 - 53 (U/L)    Alkaline Phosphatase 70  39 - 117 (U/L)    Total Bilirubin 0.6  0.3 - 1.2 (mg/dL)    Bilirubin, Direct 0.1  0.0 - 0.3 (mg/dL)    Indirect Bilirubin 0.5  0.3 - 0.9 (mg/dL)   URINALYSIS, ROUTINE W REFLEX MICROSCOPIC     Status: Abnormal   Collection Time   09/01/11  4:05 PM      Component Value Range Comment   Color, Urine YELLOW  YELLOW     APPearance CLEAR  CLEAR     Specific Gravity, Urine 1.014  1.005 - 1.030     pH 7.5  5.0 - 8.0     Glucose, UA NEGATIVE  NEGATIVE (mg/dL)    Hgb urine dipstick NEGATIVE  NEGATIVE     Bilirubin Urine NEGATIVE  NEGATIVE     Ketones, ur TRACE (*) NEGATIVE (mg/dL)    Protein, ur NEGATIVE  NEGATIVE (mg/dL)    Urobilinogen, UA 1.0  0.0 - 1.0 (mg/dL)    Nitrite NEGATIVE  NEGATIVE     Leukocytes, UA NEGATIVE  NEGATIVE  MICROSCOPIC NOT DONE ON URINES WITH NEGATIVE PROTEIN, BLOOD, LEUKOCYTES, NITRITE, OR GLUCOSE <1000 mg/dL.  POCT I-STAT TROPONIN I     Status: Normal   Collection Time   09/01/11  4:09 PM      Component Value Range Comment   Troponin i, poc 0.01  0.00 - 0.08 (ng/mL)    Comment 3  CARDIAC PANEL(CRET KIN+CKTOT+MB+TROPI)     Status: Normal   Collection Time   09/01/11  9:45 PM      Component Value Range Comment   Total CK 46  7 - 232 (U/L)    CK, MB 1.5  0.3 - 4.0 (ng/mL)    Troponin I <0.30  <0.30 (ng/mL)    Relative Index RELATIVE INDEX IS INVALID  0.0 - 2.5    BASIC METABOLIC PANEL     Status: Abnormal   Collection Time   09/02/11  4:55 AM      Component Value Range Comment   Sodium 137  135 - 145 (mEq/L)    Potassium 3.5  3.5 - 5.1 (mEq/L)    Chloride 101  96 - 112 (mEq/L)    CO2  25  19 - 32 (mEq/L)    Glucose, Bld 97  70 - 99 (mg/dL)    BUN 26 (*) 6 - 23 (mg/dL)    Creatinine, Ser 1.61 (*) 0.50 - 1.35 (mg/dL)    Calcium 8.9  8.4 - 10.5 (mg/dL)    GFR calc non Af Amer 38 (*) >90 (mL/min)    GFR calc Af Amer 45 (*) >90 (mL/min)   CARDIAC PANEL(CRET KIN+CKTOT+MB+TROPI)     Status: Normal   Collection Time   09/02/11  4:55 AM      Component Value Range Comment   Total CK 34  7 - 232 (U/L)    CK, MB 1.5  0.3 - 4.0 (ng/mL)    Troponin I <0.30  <0.30 (ng/mL)    Relative Index RELATIVE INDEX IS INVALID  0.0 - 2.5    CARDIAC PANEL(CRET KIN+CKTOT+MB+TROPI)     Status: Normal   Collection Time   09/02/11  1:01 PM      Component Value Range Comment   Total CK 36  7 - 232 (U/L)    CK, MB 1.3  0.3 - 4.0 (ng/mL)    Troponin I <0.30  <0.30 (ng/mL)    Relative Index RELATIVE INDEX IS INVALID  0.0 - 2.5    TSH     Status: Normal   Collection Time   09/03/11  4:24 AM      Component Value Range Comment   TSH 1.568  0.350 - 4.500 (uIU/mL)   BASIC METABOLIC PANEL     Status: Abnormal   Collection Time   09/03/11  4:24 AM      Component Value Range Comment   Sodium 137  135 - 145 (mEq/L)    Potassium 3.5  3.5 - 5.1 (mEq/L)    Chloride 99  96 - 112 (mEq/L)    CO2 25  19 - 32 (mEq/L)    Glucose, Bld 102 (*) 70 - 99 (mg/dL)    BUN 28 (*) 6 - 23 (mg/dL)    Creatinine, Ser 0.96 (*) 0.50 - 1.35 (mg/dL)    Calcium 8.9  8.4 - 10.5 (mg/dL)    GFR calc non Af Amer 39 (*) >90 (mL/min)    GFR calc Af Amer 46 (*) >90 (mL/min)   CBC     Status: Normal   Collection Time   09/03/11  4:24 AM      Component Value Range Comment   WBC 9.1  4.0 - 10.5 (K/uL)    RBC 4.31  4.22 - 5.81 (MIL/uL)    Hemoglobin 14.1  13.0 - 17.0 (g/dL)    HCT 04.5  40.9 - 81.1 (%)    MCV 94.9  78.0 -  100.0 (fL)    MCH 32.7  26.0 - 34.0 (pg)    MCHC 34.5  30.0 - 36.0 (g/dL)    RDW 16.1  09.6 - 04.5 (%)    Platelets 292  150 - 400 (K/uL)      HPI :78 year old man presented to the emergency department with  two-day history of shortness of breath and orthopnea. Patient was in his usual state of health until yesterday he developed sudden shortness of breath in the evening. He was unable to sleep at night even though the head of his bed was greater than 45. No chest pain. Breathing difficulties persisted and he presented to his primary care physician's office. He was subsequently transferred to the emergency department for further evaluation.  Patient has a history of coronary artery disease with angioplasty 1994 has had no problems since. He has been on Lasix for many years which until recently has controlled his edema well. He was recently seen by his nephrologist and Lasix was discontinued and torsemide was started.   In the emergency department patient was noted to be afebrile and vital signs were stable with minimal hypoxia. Patient was given 80 mg IV Lasix. Chemistry panel was notable for a creatinine of 1.44 (no previous data points available for comparison). Troponin was negative. CBC unremarkable, urinalysis unremarkable. Chest x-ray suggested pulmonary edema.He was referred for admission for acute systolic congestive heart failure.   HOSPITAL COURSE: * #1 acute CHF probably diastolic, 2-D echo results pending. Treated with IV Lasix with good urine output and symptom resolution. Continued on diastolic. Not on an ACE inhibitor because of renal insufficiency, however this may need to be initiated at some point #2 chronic kidney disease baseline creatinine probably around 1.4. Despite aggressive diuresis with IV Lasix. Creatinine ranged from 1.4-1.6. She will need a repeat be met in 1 week #3 hypertension uncontrolled initially, Norvasc held because of dependent edema, however this was resumed along with bisoprolol prior to discharge.   Discharge Exam:  Blood pressure 136/70, pulse 58, temperature 98.3 F (36.8 C), temperature source Oral, resp. rate 15, height 5\' 7"  (1.702 m), weight 69.264 kg (152 lb  11.2 oz), SpO2 96.00%.  General: Appears calm and comfortable. Exam and in the emergency department. Sitting up on stretcher. Nontoxic.  Eyes: Pupils round and equal. Lids and irises appear unremarkable.  ENT: Grossly normal hearing. Lips and tongue appear unremarkable. Fair dentition.  Neck: No lymphadenopathy or masses. No thyromegaly.  Cardiovascular: Regular rate and rhythm. No murmur, rub, gallop. 2+ bilateral lower extremity edema.  Respiratory: Clear to auscultation bilaterally (anteriorly). Posteriorly, bilateral rales noted. Normal work of breathing.  Abdomen: Soft, nontender, nondistended.  Skin: Appears grossly unremarkable.  Musculoskeletal: Appears grossly unremarkable.  Psychiatric: Grossly normal mood and affect. Speech fluent and appropriate.       Discharge Orders    Future Orders Please Complete By Expires   Diet - low sodium heart healthy      Scheduling Instructions:   Salt restriction   Increase activity slowly      Call MD for:  temperature >100.4      Call MD for:  difficulty breathing, headache or visual disturbances         Follow-up Information    Follow up with Antionette Char (chf , needs bmet in one week)    Contact information:   [facility] [address] [place] [address] [phone]       Follow up with Londell Moh,  MD. Dante Gang , needs bmet in one week)    Contact information:   [address], [address] [place] [facility] [place] [address] [phone]          Signed: Richarda Overlie 09/03/2011, 12:57 PM

## 2011-09-03 NOTE — Progress Notes (Signed)
Patient has been reviewed for [facility] Care Management services.  He is not appropriate for services at this time. For any questions please contact Anibal Henderson BSN RN [facility] Liaison Nurse at [phone].  Thank you for choosing [facility].

## 2011-09-08 DIAGNOSIS — I10 Essential (primary) hypertension: Secondary | ICD-10-CM | POA: Diagnosis not present

## 2011-09-08 DIAGNOSIS — I509 Heart failure, unspecified: Secondary | ICD-10-CM | POA: Diagnosis not present

## 2011-09-08 DIAGNOSIS — N189 Chronic kidney disease, unspecified: Secondary | ICD-10-CM | POA: Diagnosis not present

## 2011-09-08 DIAGNOSIS — R609 Edema, unspecified: Secondary | ICD-10-CM | POA: Diagnosis not present

## 2011-09-17 DIAGNOSIS — L600 Ingrowing nail: Secondary | ICD-10-CM | POA: Diagnosis not present

## 2011-09-25 DIAGNOSIS — I2510 Atherosclerotic heart disease of native coronary artery without angina pectoris: Secondary | ICD-10-CM | POA: Diagnosis not present

## 2011-09-25 DIAGNOSIS — E7800 Pure hypercholesterolemia, unspecified: Secondary | ICD-10-CM | POA: Diagnosis not present

## 2011-09-25 DIAGNOSIS — I2589 Other forms of chronic ischemic heart disease: Secondary | ICD-10-CM | POA: Diagnosis not present

## 2011-09-25 DIAGNOSIS — Z125 Encounter for screening for malignant neoplasm of prostate: Secondary | ICD-10-CM | POA: Diagnosis not present

## 2011-09-25 DIAGNOSIS — K219 Gastro-esophageal reflux disease without esophagitis: Secondary | ICD-10-CM | POA: Diagnosis not present

## 2011-09-25 DIAGNOSIS — I10 Essential (primary) hypertension: Secondary | ICD-10-CM | POA: Diagnosis not present

## 2011-09-25 DIAGNOSIS — Z79899 Other long term (current) drug therapy: Secondary | ICD-10-CM | POA: Diagnosis not present

## 2011-09-30 DIAGNOSIS — E7800 Pure hypercholesterolemia, unspecified: Secondary | ICD-10-CM | POA: Diagnosis not present

## 2011-09-30 DIAGNOSIS — Z1212 Encounter for screening for malignant neoplasm of rectum: Secondary | ICD-10-CM | POA: Diagnosis not present

## 2011-09-30 DIAGNOSIS — I509 Heart failure, unspecified: Secondary | ICD-10-CM | POA: Diagnosis not present

## 2011-09-30 DIAGNOSIS — L820 Inflamed seborrheic keratosis: Secondary | ICD-10-CM | POA: Diagnosis not present

## 2011-09-30 DIAGNOSIS — I10 Essential (primary) hypertension: Secondary | ICD-10-CM | POA: Diagnosis not present

## 2011-09-30 DIAGNOSIS — L821 Other seborrheic keratosis: Secondary | ICD-10-CM | POA: Diagnosis not present

## 2011-09-30 DIAGNOSIS — R609 Edema, unspecified: Secondary | ICD-10-CM | POA: Diagnosis not present

## 2011-10-09 DIAGNOSIS — R0602 Shortness of breath: Secondary | ICD-10-CM | POA: Diagnosis not present

## 2011-10-09 DIAGNOSIS — I509 Heart failure, unspecified: Secondary | ICD-10-CM | POA: Diagnosis not present

## 2011-10-09 DIAGNOSIS — I2510 Atherosclerotic heart disease of native coronary artery without angina pectoris: Secondary | ICD-10-CM | POA: Diagnosis not present

## 2011-11-02 DIAGNOSIS — I10 Essential (primary) hypertension: Secondary | ICD-10-CM | POA: Diagnosis not present

## 2011-11-02 DIAGNOSIS — I2510 Atherosclerotic heart disease of native coronary artery without angina pectoris: Secondary | ICD-10-CM | POA: Diagnosis not present

## 2011-11-02 DIAGNOSIS — I509 Heart failure, unspecified: Secondary | ICD-10-CM | POA: Diagnosis not present

## 2011-11-23 DIAGNOSIS — N189 Chronic kidney disease, unspecified: Secondary | ICD-10-CM | POA: Diagnosis not present

## 2011-11-23 DIAGNOSIS — I509 Heart failure, unspecified: Secondary | ICD-10-CM | POA: Diagnosis not present

## 2011-11-23 DIAGNOSIS — R413 Other amnesia: Secondary | ICD-10-CM | POA: Diagnosis not present

## 2011-11-23 DIAGNOSIS — R0602 Shortness of breath: Secondary | ICD-10-CM | POA: Diagnosis not present

## 2011-11-23 DIAGNOSIS — F329 Major depressive disorder, single episode, unspecified: Secondary | ICD-10-CM | POA: Diagnosis not present

## 2011-11-23 DIAGNOSIS — R609 Edema, unspecified: Secondary | ICD-10-CM | POA: Diagnosis not present

## 2011-12-03 DIAGNOSIS — I6529 Occlusion and stenosis of unspecified carotid artery: Secondary | ICD-10-CM | POA: Diagnosis not present

## 2011-12-03 DIAGNOSIS — F329 Major depressive disorder, single episode, unspecified: Secondary | ICD-10-CM | POA: Diagnosis not present

## 2011-12-03 DIAGNOSIS — I6330 Cerebral infarction due to thrombosis of unspecified cerebral artery: Secondary | ICD-10-CM | POA: Diagnosis not present

## 2011-12-03 DIAGNOSIS — G47419 Narcolepsy without cataplexy: Secondary | ICD-10-CM | POA: Diagnosis not present

## 2011-12-03 DIAGNOSIS — R413 Other amnesia: Secondary | ICD-10-CM | POA: Diagnosis not present

## 2011-12-10 DIAGNOSIS — R413 Other amnesia: Secondary | ICD-10-CM | POA: Diagnosis not present

## 2011-12-10 DIAGNOSIS — R5381 Other malaise: Secondary | ICD-10-CM | POA: Diagnosis not present

## 2011-12-10 DIAGNOSIS — G319 Degenerative disease of nervous system, unspecified: Secondary | ICD-10-CM | POA: Diagnosis not present

## 2011-12-10 DIAGNOSIS — R269 Unspecified abnormalities of gait and mobility: Secondary | ICD-10-CM | POA: Diagnosis not present

## 2011-12-10 DIAGNOSIS — I6330 Cerebral infarction due to thrombosis of unspecified cerebral artery: Secondary | ICD-10-CM | POA: Diagnosis not present

## 2011-12-10 DIAGNOSIS — G47419 Narcolepsy without cataplexy: Secondary | ICD-10-CM | POA: Diagnosis not present

## 2011-12-10 DIAGNOSIS — I6789 Other cerebrovascular disease: Secondary | ICD-10-CM | POA: Diagnosis not present

## 2011-12-10 DIAGNOSIS — I6529 Occlusion and stenosis of unspecified carotid artery: Secondary | ICD-10-CM | POA: Diagnosis not present

## 2011-12-10 DIAGNOSIS — R4182 Altered mental status, unspecified: Secondary | ICD-10-CM | POA: Diagnosis not present

## 2012-01-05 DIAGNOSIS — L820 Inflamed seborrheic keratosis: Secondary | ICD-10-CM | POA: Diagnosis not present

## 2012-01-05 DIAGNOSIS — F329 Major depressive disorder, single episode, unspecified: Secondary | ICD-10-CM | POA: Diagnosis not present

## 2012-01-05 DIAGNOSIS — L821 Other seborrheic keratosis: Secondary | ICD-10-CM | POA: Diagnosis not present

## 2012-01-05 DIAGNOSIS — Z23 Encounter for immunization: Secondary | ICD-10-CM | POA: Diagnosis not present

## 2012-01-07 DIAGNOSIS — J449 Chronic obstructive pulmonary disease, unspecified: Secondary | ICD-10-CM | POA: Diagnosis not present

## 2012-01-07 DIAGNOSIS — I2510 Atherosclerotic heart disease of native coronary artery without angina pectoris: Secondary | ICD-10-CM | POA: Diagnosis not present

## 2012-01-11 DIAGNOSIS — I6789 Other cerebrovascular disease: Secondary | ICD-10-CM | POA: Diagnosis not present

## 2012-01-11 DIAGNOSIS — R4182 Altered mental status, unspecified: Secondary | ICD-10-CM | POA: Diagnosis not present

## 2012-01-11 DIAGNOSIS — G319 Degenerative disease of nervous system, unspecified: Secondary | ICD-10-CM | POA: Diagnosis not present

## 2012-01-18 DIAGNOSIS — F329 Major depressive disorder, single episode, unspecified: Secondary | ICD-10-CM | POA: Diagnosis not present

## 2012-01-18 DIAGNOSIS — R413 Other amnesia: Secondary | ICD-10-CM | POA: Diagnosis not present

## 2012-02-03 DIAGNOSIS — I6529 Occlusion and stenosis of unspecified carotid artery: Secondary | ICD-10-CM | POA: Diagnosis not present

## 2012-02-04 DIAGNOSIS — I6529 Occlusion and stenosis of unspecified carotid artery: Secondary | ICD-10-CM | POA: Diagnosis not present

## 2012-02-08 DIAGNOSIS — I658 Occlusion and stenosis of other precerebral arteries: Secondary | ICD-10-CM | POA: Diagnosis not present

## 2012-02-08 DIAGNOSIS — I6529 Occlusion and stenosis of unspecified carotid artery: Secondary | ICD-10-CM | POA: Diagnosis not present

## 2012-02-11 DIAGNOSIS — Z7982 Long term (current) use of aspirin: Secondary | ICD-10-CM | POA: Diagnosis not present

## 2012-02-11 DIAGNOSIS — E7800 Pure hypercholesterolemia, unspecified: Secondary | ICD-10-CM | POA: Diagnosis not present

## 2012-02-11 DIAGNOSIS — R4182 Altered mental status, unspecified: Secondary | ICD-10-CM | POA: Diagnosis not present

## 2012-02-11 DIAGNOSIS — Z79899 Other long term (current) drug therapy: Secondary | ICD-10-CM | POA: Diagnosis not present

## 2012-02-25 DIAGNOSIS — F329 Major depressive disorder, single episode, unspecified: Secondary | ICD-10-CM | POA: Diagnosis not present

## 2012-02-25 DIAGNOSIS — F411 Generalized anxiety disorder: Secondary | ICD-10-CM | POA: Diagnosis not present

## 2012-02-25 DIAGNOSIS — Z0000 Encounter for general adult medical examination without abnormal findings: Secondary | ICD-10-CM | POA: Diagnosis not present

## 2012-03-14 DIAGNOSIS — G4710 Hypersomnia, unspecified: Secondary | ICD-10-CM | POA: Diagnosis not present

## 2012-03-14 DIAGNOSIS — R413 Other amnesia: Secondary | ICD-10-CM | POA: Diagnosis not present

## 2012-03-14 DIAGNOSIS — I6529 Occlusion and stenosis of unspecified carotid artery: Secondary | ICD-10-CM | POA: Diagnosis not present

## 2012-03-28 DIAGNOSIS — C4440 Unspecified malignant neoplasm of skin of scalp and neck: Secondary | ICD-10-CM | POA: Diagnosis not present

## 2012-03-28 DIAGNOSIS — F329 Major depressive disorder, single episode, unspecified: Secondary | ICD-10-CM | POA: Diagnosis not present

## 2012-03-28 DIAGNOSIS — I10 Essential (primary) hypertension: Secondary | ICD-10-CM | POA: Diagnosis not present

## 2012-04-13 DIAGNOSIS — I252 Old myocardial infarction: Secondary | ICD-10-CM | POA: Diagnosis not present

## 2012-04-13 DIAGNOSIS — I2510 Atherosclerotic heart disease of native coronary artery without angina pectoris: Secondary | ICD-10-CM | POA: Diagnosis not present

## 2012-04-13 DIAGNOSIS — I501 Left ventricular failure: Secondary | ICD-10-CM | POA: Diagnosis not present

## 2012-04-14 DIAGNOSIS — Z85828 Personal history of other malignant neoplasm of skin: Secondary | ICD-10-CM | POA: Diagnosis not present

## 2012-04-14 DIAGNOSIS — C4442 Squamous cell carcinoma of skin of scalp and neck: Secondary | ICD-10-CM | POA: Diagnosis not present

## 2012-04-14 DIAGNOSIS — D485 Neoplasm of uncertain behavior of skin: Secondary | ICD-10-CM | POA: Diagnosis not present

## 2012-04-14 DIAGNOSIS — L570 Actinic keratosis: Secondary | ICD-10-CM | POA: Diagnosis not present

## 2012-04-14 DIAGNOSIS — L578 Other skin changes due to chronic exposure to nonionizing radiation: Secondary | ICD-10-CM | POA: Diagnosis not present

## 2012-04-25 DIAGNOSIS — F329 Major depressive disorder, single episode, unspecified: Secondary | ICD-10-CM | POA: Diagnosis not present

## 2012-04-25 DIAGNOSIS — K219 Gastro-esophageal reflux disease without esophagitis: Secondary | ICD-10-CM | POA: Diagnosis not present

## 2012-04-28 DIAGNOSIS — I6529 Occlusion and stenosis of unspecified carotid artery: Secondary | ICD-10-CM | POA: Diagnosis not present

## 2012-04-28 DIAGNOSIS — G47419 Narcolepsy without cataplexy: Secondary | ICD-10-CM | POA: Diagnosis not present

## 2012-04-28 DIAGNOSIS — G4710 Hypersomnia, unspecified: Secondary | ICD-10-CM | POA: Diagnosis not present

## 2012-04-28 DIAGNOSIS — R413 Other amnesia: Secondary | ICD-10-CM | POA: Diagnosis not present

## 2012-05-10 DIAGNOSIS — I6529 Occlusion and stenosis of unspecified carotid artery: Secondary | ICD-10-CM | POA: Diagnosis not present

## 2012-05-16 DIAGNOSIS — C4442 Squamous cell carcinoma of skin of scalp and neck: Secondary | ICD-10-CM | POA: Diagnosis not present

## 2012-07-12 ENCOUNTER — Emergency Department (HOSPITAL_COMMUNITY): Payer: Medicare Other

## 2012-07-12 ENCOUNTER — Inpatient Hospital Stay (HOSPITAL_COMMUNITY)
Admission: EM | Admit: 2012-07-12 | Discharge: 2012-07-25 | DRG: 438 | Disposition: A | Discharge status: Skilled Nursing Facility | Payer: Medicare Other | Attending: Internal Medicine | Admitting: Internal Medicine

## 2012-07-12 ENCOUNTER — Encounter (HOSPITAL_COMMUNITY): Payer: Self-pay | Admitting: *Deleted

## 2012-07-12 DIAGNOSIS — J9600 Acute respiratory failure, unspecified whether with hypoxia or hypercapnia: Secondary | ICD-10-CM | POA: Diagnosis present

## 2012-07-12 DIAGNOSIS — N189 Chronic kidney disease, unspecified: Secondary | ICD-10-CM

## 2012-07-12 DIAGNOSIS — K2970 Gastritis, unspecified, without bleeding: Secondary | ICD-10-CM | POA: Diagnosis present

## 2012-07-12 DIAGNOSIS — I5042 Chronic combined systolic (congestive) and diastolic (congestive) heart failure: Secondary | ICD-10-CM | POA: Diagnosis present

## 2012-07-12 DIAGNOSIS — N183 Chronic kidney disease, stage 3 unspecified: Secondary | ICD-10-CM | POA: Diagnosis present

## 2012-07-12 DIAGNOSIS — Z7982 Long term (current) use of aspirin: Secondary | ICD-10-CM

## 2012-07-12 DIAGNOSIS — R5381 Other malaise: Secondary | ICD-10-CM | POA: Diagnosis not present

## 2012-07-12 DIAGNOSIS — I129 Hypertensive chronic kidney disease with stage 1 through stage 4 chronic kidney disease, or unspecified chronic kidney disease: Secondary | ICD-10-CM | POA: Diagnosis present

## 2012-07-12 DIAGNOSIS — K8020 Calculus of gallbladder without cholecystitis without obstruction: Secondary | ICD-10-CM | POA: Diagnosis not present

## 2012-07-12 DIAGNOSIS — J189 Pneumonia, unspecified organism: Secondary | ICD-10-CM | POA: Diagnosis not present

## 2012-07-12 DIAGNOSIS — E7800 Pure hypercholesterolemia, unspecified: Secondary | ICD-10-CM | POA: Diagnosis present

## 2012-07-12 DIAGNOSIS — K449 Diaphragmatic hernia without obstruction or gangrene: Secondary | ICD-10-CM | POA: Diagnosis present

## 2012-07-12 DIAGNOSIS — K209 Esophagitis, unspecified without bleeding: Secondary | ICD-10-CM | POA: Diagnosis not present

## 2012-07-12 DIAGNOSIS — I509 Heart failure, unspecified: Secondary | ICD-10-CM | POA: Diagnosis present

## 2012-07-12 DIAGNOSIS — E876 Hypokalemia: Secondary | ICD-10-CM | POA: Diagnosis present

## 2012-07-12 DIAGNOSIS — I2510 Atherosclerotic heart disease of native coronary artery without angina pectoris: Secondary | ICD-10-CM | POA: Diagnosis not present

## 2012-07-12 DIAGNOSIS — Z87891 Personal history of nicotine dependence: Secondary | ICD-10-CM | POA: Diagnosis not present

## 2012-07-12 DIAGNOSIS — R4789 Other speech disturbances: Secondary | ICD-10-CM | POA: Diagnosis present

## 2012-07-12 DIAGNOSIS — I10 Essential (primary) hypertension: Secondary | ICD-10-CM

## 2012-07-12 DIAGNOSIS — K859 Acute pancreatitis without necrosis or infection, unspecified: Secondary | ICD-10-CM | POA: Diagnosis not present

## 2012-07-12 DIAGNOSIS — Z9861 Coronary angioplasty status: Secondary | ICD-10-CM

## 2012-07-12 DIAGNOSIS — E871 Hypo-osmolality and hyponatremia: Secondary | ICD-10-CM | POA: Diagnosis present

## 2012-07-12 DIAGNOSIS — R066 Hiccough: Secondary | ICD-10-CM | POA: Diagnosis not present

## 2012-07-12 DIAGNOSIS — K5289 Other specified noninfective gastroenteritis and colitis: Secondary | ICD-10-CM | POA: Diagnosis present

## 2012-07-12 DIAGNOSIS — Q391 Atresia of esophagus with tracheo-esophageal fistula: Secondary | ICD-10-CM

## 2012-07-12 DIAGNOSIS — R0602 Shortness of breath: Secondary | ICD-10-CM | POA: Diagnosis not present

## 2012-07-12 DIAGNOSIS — R0902 Hypoxemia: Secondary | ICD-10-CM | POA: Diagnosis present

## 2012-07-12 DIAGNOSIS — N320 Bladder-neck obstruction: Secondary | ICD-10-CM | POA: Diagnosis present

## 2012-07-12 DIAGNOSIS — R748 Abnormal levels of other serum enzymes: Secondary | ICD-10-CM | POA: Diagnosis present

## 2012-07-12 DIAGNOSIS — K2270 Barrett's esophagus without dysplasia: Secondary | ICD-10-CM | POA: Diagnosis present

## 2012-07-12 DIAGNOSIS — R112 Nausea with vomiting, unspecified: Secondary | ICD-10-CM | POA: Diagnosis not present

## 2012-07-12 DIAGNOSIS — I252 Old myocardial infarction: Secondary | ICD-10-CM

## 2012-07-12 DIAGNOSIS — E785 Hyperlipidemia, unspecified: Secondary | ICD-10-CM | POA: Diagnosis present

## 2012-07-12 DIAGNOSIS — Z79899 Other long term (current) drug therapy: Secondary | ICD-10-CM

## 2012-07-12 DIAGNOSIS — I119 Hypertensive heart disease without heart failure: Secondary | ICD-10-CM | POA: Diagnosis not present

## 2012-07-12 DIAGNOSIS — Q393 Congenital stenosis and stricture of esophagus: Secondary | ICD-10-CM | POA: Diagnosis not present

## 2012-07-12 DIAGNOSIS — B3781 Candidal esophagitis: Secondary | ICD-10-CM | POA: Diagnosis present

## 2012-07-12 DIAGNOSIS — K2990 Gastroduodenitis, unspecified, without bleeding: Secondary | ICD-10-CM | POA: Diagnosis present

## 2012-07-12 DIAGNOSIS — R0789 Other chest pain: Secondary | ICD-10-CM | POA: Diagnosis not present

## 2012-07-12 DIAGNOSIS — G47419 Narcolepsy without cataplexy: Secondary | ICD-10-CM | POA: Diagnosis present

## 2012-07-12 DIAGNOSIS — Z8673 Personal history of transient ischemic attack (TIA), and cerebral infarction without residual deficits: Secondary | ICD-10-CM

## 2012-07-12 DIAGNOSIS — I319 Disease of pericardium, unspecified: Secondary | ICD-10-CM | POA: Diagnosis not present

## 2012-07-12 DIAGNOSIS — Z5189 Encounter for other specified aftercare: Secondary | ICD-10-CM | POA: Diagnosis not present

## 2012-07-12 LAB — CBC WITH DIFFERENTIAL/PLATELET
Basophils Absolute: 0 10*3/uL (ref 0.0–0.1)
Eosinophils Absolute: 0 10*3/uL (ref 0.0–0.7)
Eosinophils Relative: 0 % (ref 0–5)
HCT: 40.1 % (ref 39.0–52.0)
Lymphocytes Relative: 8 % — ABNORMAL LOW (ref 12–46)
Lymphs Abs: 0.5 10*3/uL — ABNORMAL LOW (ref 0.7–4.0)
MCH: 33.1 pg (ref 26.0–34.0)
MCV: 99 fL (ref 78.0–100.0)
Monocytes Absolute: 0.5 10*3/uL (ref 0.1–1.0)
RDW: 13.2 % (ref 11.5–15.5)
WBC: 6 10*3/uL (ref 4.0–10.5)

## 2012-07-12 LAB — COMPREHENSIVE METABOLIC PANEL
CO2: 27 mEq/L (ref 19–32)
Calcium: 8.6 mg/dL (ref 8.4–10.5)
Creatinine, Ser: 1.32 mg/dL (ref 0.50–1.35)
GFR calc Af Amer: 57 mL/min — ABNORMAL LOW (ref >90)
GFR calc non Af Amer: 49 mL/min — ABNORMAL LOW (ref >90)
Glucose, Bld: 118 mg/dL — ABNORMAL HIGH (ref 70–99)
Total Protein: 5.9 g/dL — ABNORMAL LOW (ref 6.0–8.3)

## 2012-07-12 LAB — TROPONIN I: Troponin I: <0.3 ng/mL (ref <0.30)

## 2012-07-12 LAB — LIPASE, BLOOD: Lipase: 91 U/L — ABNORMAL HIGH (ref 11–59)

## 2012-07-12 MED ORDER — SODIUM CHLORIDE 0.9 % IV BOLUS (SEPSIS)
1000.0000 mL | Freq: Once | INTRAVENOUS | Status: AC
  Administered 2012-07-12: 1000 mL via INTRAVENOUS

## 2012-07-12 MED ORDER — SODIUM OXYBATE 500 MG/ML PO SOLN
4000.0000 mg | ORAL | Status: DC
  Administered 2012-07-13 – 2012-07-18 (×11): 4000 mg via ORAL
  Administered 2012-07-19: 3000 mg via ORAL
  Administered 2012-07-19 – 2012-07-20 (×2): 4000 mg via ORAL
  Administered 2012-07-20: 3000 mg via ORAL
  Administered 2012-07-21 – 2012-07-25 (×9): 4000 mg via ORAL
  Filled 2012-07-12: qty 8

## 2012-07-12 MED ORDER — ONDANSETRON HCL 4 MG/2ML IJ SOLN
4.0000 mg | Freq: Once | INTRAMUSCULAR | Status: AC
  Administered 2012-07-12: 4 mg via INTRAVENOUS
  Filled 2012-07-12: qty 2

## 2012-07-12 MED ORDER — LORAZEPAM 2 MG/ML IJ SOLN
0.5000 mg | Freq: Once | INTRAMUSCULAR | Status: AC
  Administered 2012-07-12: 0.5 mg via INTRAVENOUS
  Filled 2012-07-12: qty 1

## 2012-07-12 MED ORDER — IOHEXOL 300 MG/ML  SOLN
80.0000 mL | Freq: Once | INTRAMUSCULAR | Status: AC | PRN
  Administered 2012-07-12: 80 mL via INTRAVENOUS

## 2012-07-12 NOTE — ED Notes (Signed)
Pt c/o hiccups x's 2 weeks and vomiting x's 2 last night. Pt also c/o generalized weakness. Denies complaints of pain. Also reports some SOB.

## 2012-07-12 NOTE — ED Notes (Signed)
EKG completed in triage by Stark Jock RN--shown to Dr. Jeraldine Loots.

## 2012-07-12 NOTE — ED Notes (Signed)
[id number]<AV> Expected date:<BR> Expected time:<BR> Means of arrival:<BR> Comments:<BR> Hold for Bed 4

## 2012-07-12 NOTE — ED Notes (Signed)
Patient transported to CT 

## 2012-07-12 NOTE — H&P (Signed)
[facility] History and Physical  RENO CLASBY ZOX:[id number] DOB: 1929/01/18 DOA: 07/12/2012  Referring physician: Dr.Lockwood. PCP: Londell Moh, MD  Specialists: None.  Chief Complaint: Nausea and vomiting with hiccups.  HPI: Kirk Townsend is a 79 y.o. male history of CAD status post angioplasty, narcolepsy, hypertension, CHF, chronic kidney disease was brought to the ER because of nausea vomiting. Patient has been having hiccups for last 2 weeks. But last 2 days patient has been having nausea vomiting. Patient denies any abdominal pain or diarrhea. Has been unable to keep in anything because of the hiccups and nausea vomiting. In the ER patient had CT abdomen and pelvis which show features for possible gastroenteritis. Also shows cholelithiasis. Labs show mildly elevated lipase but patient denies any abdominal pain. Patient at this time will be admitted for further management. Patient's EKG shows ST-T changes in the lateral leads but patient does not have any chest pain or shortness of breath.  Review of Systems: As presented in the history of presenting illness nothing else significant.  Past Medical History  Diagnosis Date  . Coronary artery disease   . Hypertension   . Renal disorder   . High cholesterol   . Narcolepsy   . Myocardial infarction     1994  . Barrett's esophagus    Past Surgical History  Procedure Laterality Date  . Hernia repair    . Coronary angioplasty      1994. No stent.   Social History:  reports that he quit smoking about 55 years ago. He has never used smokeless tobacco. He reports that he does not drink alcohol or use illicit drugs. Lives at home with his wife. where does patient live-- Can do ADLs. Can patient participate in ADLs?  Allergies  Allergen Reactions  . Metoprolol Other (See Comments)    Stomach cramps, gas, diarrhea    History reviewed. No pertinent family history.    Prior to Admission medications   Medication  Sig Start Date End Date Taking? Authorizing Provider  amphetamine-dextroamphetamine (ADDERALL) 10 MG tablet Take 20 mg by mouth 2 (two) times daily.    Yes Historical Provider, MD  Armodafinil (NUVIGIL) 250 MG tablet Take 250 mg by mouth daily.   Yes Historical Provider, MD  aspirin 81 MG chewable tablet Chew 81 mg by mouth daily.   Yes Historical Provider, MD  bisoprolol (ZEBETA) 5 MG tablet Take 5 mg by mouth daily.   Yes Historical Provider, MD  buPROPion (WELLBUTRIN XL) 150 MG 24 hr tablet Take 150 mg by mouth daily.   Yes Historical Provider, MD  calcium carbonate (OS-CAL - DOSED IN MG OF ELEMENTAL CALCIUM) 1250 MG tablet Take 1 tablet by mouth daily.   Yes Historical Provider, MD  donepezil (ARICEPT) 5 MG tablet Take 5 mg by mouth at bedtime.   Yes Historical Provider, MD  furosemide (LASIX) 20 MG tablet Take 20 mg by mouth daily.   Yes Historical Provider, MD  Multiple Vitamin (MULITIVITAMIN WITH MINERALS) TABS Take 1 tablet by mouth daily.   Yes Historical Provider, MD  pantoprazole (PROTONIX) 40 MG tablet Take 40 mg by mouth daily.   Yes Historical Provider, MD  sertraline (ZOLOFT) 50 MG tablet Take 50 mg by mouth daily.   Yes Historical Provider, MD  simvastatin (ZOCOR) 20 MG tablet Take 20 mg by mouth every evening.   Yes Historical Provider, MD  Sodium Oxybate (XYREM PO) Take 4 g by mouth 2 (two) times daily.    Yes Historical  Provider, MD  vitamin C (ASCORBIC ACID) 500 MG tablet Take 500 mg by mouth daily.   Yes Historical Provider, MD   Physical Exam: Filed Vitals:   07/12/12 1721 07/12/12 2302  BP: 142/60 150/62  Pulse: 71 71  Temp: 98.7 F (37.1 C)   TempSrc: Oral   Resp: 18 15  SpO2: 97% 97%     General:  Well-developed well-nourished.  Eyes: Anicteric no pallor.  ENT: No discharge from the ears eyes nose or mouth.  Neck: No mass or JVD.  Cardiovascular: S1-S2 heard.  Respiratory: No rhonchi or crepitations.  Abdomen: Soft nontender bowel sounds  present.  Skin: No rash.  Musculoskeletal: No edema.  Psychiatric: Appears normal.  Neurologic: Alert oriented to time place and person. Moves all extremities.  Labs on Admission:  Basic Metabolic Panel:  Recent Labs Lab 07/12/12 1910  NA 132*  K 3.8  CL 97  CO2 27  GLUCOSE 118*  BUN 20  CREATININE 1.32  CALCIUM 8.6   Liver Function Tests:  Recent Labs Lab 07/12/12 1910  AST 29  ALT 18  ALKPHOS 69  BILITOT 0.3  PROT 5.9*  ALBUMIN 3.1*    Recent Labs Lab 07/12/12 1910  LIPASE 91*   No results found for this basename: AMMONIA,  in the last 168 hours CBC:  Recent Labs Lab 07/12/12 1910  WBC 6.0  NEUTROABS 5.0  HGB 13.4  HCT 40.1  MCV 99.0  PLT 204   Cardiac Enzymes:  Recent Labs Lab 07/12/12 1910  TROPONINI <0.30    BNP (last 3 results)  Recent Labs  09/01/11 1500  PROBNP 2960.0*   CBG: No results found for this basename: GLUCAP,  in the last 168 hours  Radiological Exams on Admission: Dg Chest 2 View  07/12/2012  *RADIOLOGY REPORT*  Clinical Data: Generalized weakness.  Hiccups.  Chest discomfort.  CHEST - 2 VIEW  Comparison: Two-view chest x-ray 11/23/2011, 09/01/2011 and 01/06/2007 [facility].  Findings: Cardiac silhouette mildly enlarged but stable.  Thoracic aorta atherosclerotic, unchanged.  Hilar and mediastinal contours otherwise unremarkable.  Hyperinflation, unchanged.  Lungs clear. Bronchovascular markings normal.  Pulmonary vascularity normal.  No pneumothorax.  No pleural effusions.  Old healed fracture involving the distal left clavicle and degenerative changes throughout the thoracic spine.  IMPRESSION: Stable COPD/emphysema and stable mild cardiomegaly.  No acute cardiopulmonary disease.   Original Report Authenticated By: Hulan Saas, M.D.    Ct Abdomen Pelvis W Contrast  07/12/2012  *RADIOLOGY REPORT*  Clinical Data: Vomiting since last night.  Generalized weakness and hiccups.  Shortness of breath.  CT ABDOMEN  AND PELVIS WITH CONTRAST  Technique:  Multidetector CT imaging of the abdomen and pelvis was performed following the standard protocol during bolus administration of intravenous contrast.  Contrast: 80mL OMNIPAQUE IOHEXOL 300 MG/ML  SOLN  Comparison: None.  Findings: Technically limited study due to motion artifact.  Dependent atelectasis and motion artifact in the lung bases. Pectus excavatum.  Cardiac enlargement.  Mild diffuse fatty infiltration of the liver.  No focal liver lesions.  Cholelithiasis with single gallstone in the dependent portion of the gallbladder.  No gallbladder wall thickening or edema.  The pancreas is atrophic.  Spleen size is normal.  No adrenal gland nodules.  No solid mass or hydronephrosis in either kidney.  The stomach is not abnormally distended.  Small bowel are not distended but there is suggestion of wall thickening of left upper quadrant jejunal loops.  This may represent gastroenteritis or inflammatory process.  Distal small bowel are unremarkable. Colon is fluid-filled consistent with liquid stool.  No colonic distension.  No free air or free fluid in the abdomen.  Pelvis:  Prostate gland does not appear enlarged.  Bladder wall is not thickened.  No free or loculated pelvic fluid collections.  No pelvic lymphadenopathy.  Appendix is not identified.  No diverticulitis despite multiple sigmoid diverticula.  Degenerative changes throughout the lumbar spine.  No destructive bone lesions.  IMPRESSION: Cholelithiasis.  Suggestion of small bowel wall thickening involving the jejunum and liquid stool in the colon.  Changes likely to represent gastroenteritis or other inflammatory process. Mild fatty infiltration of the liver.   Original Report Authenticated By: Burman Nieves, M.D.     EKG: Independently reviewed. Sinus rhythm with lateral ST T changes.  Assessment/Plan Principal Problem:   Nausea & vomiting Active Problems:   Acute CHF   CKD (chronic kidney disease)    Hiccoughs   1. Nausea vomiting and hiccups - most likely secondary to gastroenteritis. Patient also has mildly elevated lipase. Not sure if patient has pancreatitis. Since patient has cholelithiasis at this time we will repeat LFTs and lipase in a.m. and I have ordered a sonogram of the abdomen to check for any features of cholecystitis. Presently patient is afebrile does not have a leukocytosis or any abdominal tenderness or pain. Continue with IV fluids. Caution as patient does have history of CHF. 2. Cholelithiasis - see #1. 3. CAD - denies any chest pain. But does have EKG changes. Continue antiplatelet agents. Cycle cardiac markers. 4. Hypertension - continue present medications. 5. History of CHF - presently looks dehydrated. Gently hydrate. Closely follow respiratory status. 6. Chronic kidney disease - creatinine at baseline. 7. Narcolepsy - continue home medications. 8. Hyperlipidemia - continue present medications.    Code Status: Full code.  Family Communication: Patient's wife.  Disposition Plan: Admit to inpatient.    Param Capri N. [facility] Pager [phone].  If 7PM-7AM, please contact night-coverage [email or web address] Password [facility] 07/12/2012, 11:32 PM

## 2012-07-12 NOTE — ED Provider Notes (Signed)
History     CSN: [id number]  Arrival date & time 07/12/12  1712   First MD Initiated Contact with Patient 07/12/12 1809      Chief Complaint  Patient presents with  . Emesis  . Hiccups    (Consider location/radiation/quality/duration/timing/severity/associated sxs/prior treatment) HPI  The patient presents with his wife who provides much of the history of present illness.  Over the past month the patient has had chronic hiccups and persistent anorexia.  Over this timeframe he is also developed generalized weakness and fatigue.  The patient has no pain, and denies any dyspnea, which is different from the nursing note  Today, the patient had 2 episodes of emesis, nonbloody, nonbilious. The patient has had no diarrhea, no fever. There have been no clear alleviating or exacerbating factors. The patient has multiple medical problems, including narcolepsy   Past Medical History  Diagnosis Date  . Coronary artery disease   . Hypertension   . Renal disorder   . High cholesterol   . Narcolepsy   . Myocardial infarction     1994  . Barrett's esophagus     Past Surgical History  Procedure Laterality Date  . Hernia repair    . Coronary angioplasty      1994. No stent.    History reviewed. No pertinent family history.  History  Substance Use Topics  . Smoking status: Former Smoker -- 20 years    Quit date: 08/31/1956  . Smokeless tobacco: Never Used  . Alcohol Use: No      Review of Systems  Constitutional:       Per HPI, otherwise negative  HENT:       Per HPI, otherwise negative  Respiratory:       Per HPI, otherwise negative  Cardiovascular:       Per HPI, otherwise negative  Gastrointestinal: Positive for nausea and vomiting. Negative for abdominal pain, diarrhea and anal bleeding.  Endocrine:       Negative aside from HPI  Genitourinary:       Neg aside from HPI   Musculoskeletal:       Per HPI, otherwise negative  Skin: Negative.   Neurological:  Positive for weakness. Negative for syncope, light-headedness and headaches.  Psychiatric/Behavioral:       Memory loss    Allergies  Metoprolol  Home Medications   Current Outpatient Rx  Name  Route  Sig  Dispense  Refill  . amphetamine-dextroamphetamine (ADDERALL) 10 MG tablet   Oral   Take 20 mg by mouth 2 (two) times daily.          . Armodafinil (NUVIGIL) 250 MG tablet   Oral   Take 250 mg by mouth daily.         Marland Kitchen aspirin 81 MG chewable tablet   Oral   Chew 81 mg by mouth daily.         . bisoprolol (ZEBETA) 5 MG tablet   Oral   Take 5 mg by mouth daily.         Marland Kitchen buPROPion (WELLBUTRIN XL) 150 MG 24 hr tablet   Oral   Take 150 mg by mouth daily.         . calcium carbonate (OS-CAL - DOSED IN MG OF ELEMENTAL CALCIUM) 1250 MG tablet   Oral   Take 1 tablet by mouth daily.         Marland Kitchen donepezil (ARICEPT) 5 MG tablet   Oral   Take 5 mg by mouth  at bedtime.         . furosemide (LASIX) 20 MG tablet   Oral   Take 20 mg by mouth daily.         . Multiple Vitamin (MULITIVITAMIN WITH MINERALS) TABS   Oral   Take 1 tablet by mouth daily.         . pantoprazole (PROTONIX) 40 MG tablet   Oral   Take 40 mg by mouth daily.         . sertraline (ZOLOFT) 50 MG tablet   Oral   Take 50 mg by mouth daily.         . simvastatin (ZOCOR) 20 MG tablet   Oral   Take 20 mg by mouth every evening.         . Sodium Oxybate (XYREM PO)   Oral   Take 4 g by mouth 2 (two) times daily.          . vitamin C (ASCORBIC ACID) 500 MG tablet   Oral   Take 500 mg by mouth daily.           BP 142/60  Pulse 71  Temp(Src) 98.7 F (37.1 C) (Oral)  Resp 18  SpO2 97%  Physical Exam  Nursing note and vitals reviewed. Constitutional: He is oriented to person, place, and time. He appears well-developed. He has a sickly appearance. No distress.  HENT:  Head: Normocephalic and atraumatic.  Eyes: Conjunctivae and EOM are normal.  Cardiovascular: Normal  rate and regular rhythm.   Pulmonary/Chest: Effort normal. No stridor. No respiratory distress.  Abdominal: He exhibits no distension. There is no tenderness.  Musculoskeletal: He exhibits no edema.  Neurological: He is alert and oriented to person, place, and time.  Skin: Skin is warm and dry.  Psychiatric: He has a normal mood and affect.    ED Course  Procedures (including critical care time)  Labs Reviewed - No data to display No results found.   No diagnosis found.   Pulse ox 99% room air normal Cardiac 70 sinus rhythm normal    Date: 07/12/2012  Rate: 65  Rhythm: normal sinus rhythm  QRS Axis: left  Intervals: normal  ST/T Wave abnormalities: ST depressions laterally  Conduction Disutrbances:IVCD  Narrative Interpretation:   Old EKG Reviewed: changes noted Subthreshold st depressions in lateral leads  Patient had multiple additional episodes of copious emesis.  Update: Patient, appearing.  MDM  This elderly male presents with concerns of ongoing hiccups, as well as new nausea, vomiting, and generalized weakness.  On exam the patient initially is nauseous, with vomiting copious amounts of fluid.  The patient improved following Zofran, Ativan.  The patient's labs demonstrate several abnormalities, concerning for ongoing infectious or inflammatory process with consideration of pancreatitis or gastroenteritis among them.  Given the patient's age, comorbid his, the ongoing nausea with vomiting, he was admitted for further evaluation and management.  Gerhard Munch, MD 07/12/12 [phone]

## 2012-07-13 ENCOUNTER — Inpatient Hospital Stay (HOSPITAL_COMMUNITY): Payer: Medicare Other

## 2012-07-13 DIAGNOSIS — K859 Acute pancreatitis without necrosis or infection, unspecified: Principal | ICD-10-CM

## 2012-07-13 DIAGNOSIS — I509 Heart failure, unspecified: Secondary | ICD-10-CM

## 2012-07-13 DIAGNOSIS — I319 Disease of pericardium, unspecified: Secondary | ICD-10-CM

## 2012-07-13 LAB — CBC
Hemoglobin: 13.9 g/dL (ref 13.0–17.0)
MCH: 33.1 pg (ref 26.0–34.0)
MCV: 99 fL (ref 78.0–100.0)
Platelets: 182 10*3/uL (ref 150–400)
RBC: 4.2 MIL/uL — ABNORMAL LOW (ref 4.22–5.81)
WBC: 5.8 10*3/uL (ref 4.0–10.5)

## 2012-07-13 LAB — BASIC METABOLIC PANEL
CO2: 24 mEq/L (ref 19–32)
Chloride: 99 mEq/L (ref 96–112)
Glucose, Bld: 104 mg/dL — ABNORMAL HIGH (ref 70–99)
Potassium: 4 mEq/L (ref 3.5–5.1)
Sodium: 132 mEq/L — ABNORMAL LOW (ref 135–145)

## 2012-07-13 LAB — TROPONIN I: Troponin I: <0.3 ng/mL (ref <0.30)

## 2012-07-13 LAB — URINALYSIS, ROUTINE W REFLEX MICROSCOPIC
Ketones, ur: NEGATIVE mg/dL
Leukocytes, UA: NEGATIVE
Nitrite: NEGATIVE
Protein, ur: NEGATIVE mg/dL
Urobilinogen, UA: 1 mg/dL (ref 0.0–1.0)

## 2012-07-13 LAB — HEPATIC FUNCTION PANEL
ALT: 19 U/L (ref 0–53)
AST: 30 U/L (ref 0–37)
Albumin: 3.1 g/dL — ABNORMAL LOW (ref 3.5–5.2)
Alkaline Phosphatase: 65 U/L (ref 39–117)
Total Protein: 5.8 g/dL — ABNORMAL LOW (ref 6.0–8.3)

## 2012-07-13 LAB — LIPASE, BLOOD: Lipase: 38 U/L (ref 11–59)

## 2012-07-13 MED ORDER — SERTRALINE HCL 50 MG PO TABS
50.0000 mg | ORAL_TABLET | Freq: Every day | ORAL | Status: DC
  Administered 2012-07-13 – 2012-07-25 (×13): 50 mg via ORAL
  Filled 2012-07-13 (×14): qty 1

## 2012-07-13 MED ORDER — BUPROPION HCL ER (XL) 150 MG PO TB24
150.0000 mg | ORAL_TABLET | Freq: Every day | ORAL | Status: DC
  Administered 2012-07-13 – 2012-07-25 (×13): 150 mg via ORAL
  Filled 2012-07-13 (×13): qty 1

## 2012-07-13 MED ORDER — BOOST / RESOURCE BREEZE PO LIQD
1.0000 | Freq: Two times a day (BID) | ORAL | Status: DC
  Administered 2012-07-14 – 2012-07-19 (×9): 1 via ORAL

## 2012-07-13 MED ORDER — ACETAMINOPHEN 650 MG RE SUPP: 650.0000 mg | Freq: Four times a day (QID) | RECTAL | Status: DC | PRN

## 2012-07-13 MED ORDER — ONDANSETRON HCL 4 MG/2ML IJ SOLN
4.0000 mg | Freq: Four times a day (QID) | INTRAMUSCULAR | Status: DC | PRN
  Administered 2012-07-13: 4 mg via INTRAVENOUS
  Filled 2012-07-13 (×2): qty 2

## 2012-07-13 MED ORDER — ASPIRIN 81 MG PO CHEW
81.0000 mg | CHEWABLE_TABLET | Freq: Every day | ORAL | Status: DC
  Administered 2012-07-13 – 2012-07-25 (×13): 81 mg via ORAL
  Filled 2012-07-13 (×13): qty 1

## 2012-07-13 MED ORDER — LORAZEPAM 2 MG/ML IJ SOLN: 1.0000 mg | Freq: Four times a day (QID) | INTRAMUSCULAR | Status: DC | PRN

## 2012-07-13 MED ORDER — VITAMIN C 500 MG PO TABS
500.0000 mg | ORAL_TABLET | Freq: Every day | ORAL | Status: DC
  Administered 2012-07-13: 500 mg via ORAL
  Filled 2012-07-13: qty 1

## 2012-07-13 MED ORDER — ARMODAFINIL 250 MG PO TABS
250.0000 mg | ORAL_TABLET | Freq: Every day | ORAL | Status: DC
  Administered 2012-07-15 – 2012-07-25 (×10): 250 mg via ORAL

## 2012-07-13 MED ORDER — BISOPROLOL FUMARATE 5 MG PO TABS
5.0000 mg | ORAL_TABLET | Freq: Every day | ORAL | Status: DC
  Administered 2012-07-13 – 2012-07-24 (×12): 5 mg via ORAL
  Filled 2012-07-13 (×12): qty 1

## 2012-07-13 MED ORDER — PANTOPRAZOLE SODIUM 40 MG PO TBEC
40.0000 mg | DELAYED_RELEASE_TABLET | Freq: Every day | ORAL | Status: DC
  Administered 2012-07-13 – 2012-07-18 (×6): 40 mg via ORAL
  Filled 2012-07-13 (×8): qty 1

## 2012-07-13 MED ORDER — DONEPEZIL HCL 5 MG PO TABS
5.0000 mg | ORAL_TABLET | Freq: Every day | ORAL | Status: DC
  Administered 2012-07-13 – 2012-07-24 (×13): 5 mg via ORAL
  Filled 2012-07-13 (×14): qty 1

## 2012-07-13 MED ORDER — SODIUM CHLORIDE 0.9 % IV SOLN
25.0000 mg | Freq: Four times a day (QID) | INTRAVENOUS | Status: DC | PRN
  Administered 2012-07-13 – 2012-07-18 (×6): 25 mg via INTRAVENOUS
  Filled 2012-07-13 (×8): qty 1

## 2012-07-13 MED ORDER — ADULT MULTIVITAMIN W/MINERALS CH
1.0000 | ORAL_TABLET | Freq: Every day | ORAL | Status: DC
  Administered 2012-07-14 – 2012-07-25 (×12): 1 via ORAL
  Filled 2012-07-13 (×12): qty 1

## 2012-07-13 MED ORDER — MORPHINE SULFATE 2 MG/ML IJ SOLN: 1.0000 mg | INTRAMUSCULAR | Status: DC | PRN

## 2012-07-13 MED ORDER — ONDANSETRON HCL 4 MG PO TABS: 4.0000 mg | ORAL_TABLET | Freq: Four times a day (QID) | ORAL | Status: DC | PRN

## 2012-07-13 MED ORDER — SIMVASTATIN 20 MG PO TABS
20.0000 mg | ORAL_TABLET | Freq: Every evening | ORAL | Status: DC
  Filled 2012-07-13: qty 1

## 2012-07-13 MED ORDER — AMPHETAMINE-DEXTROAMPHETAMINE 10 MG PO TABS
20.0000 mg | ORAL_TABLET | Freq: Two times a day (BID) | ORAL | Status: DC
  Administered 2012-07-13 – 2012-07-25 (×24): 20 mg via ORAL
  Filled 2012-07-13: qty 2
  Filled 2012-07-13 (×2): qty 1
  Filled 2012-07-13 (×2): qty 2
  Filled 2012-07-13: qty 1
  Filled 2012-07-13 (×4): qty 2
  Filled 2012-07-13: qty 1
  Filled 2012-07-13 (×3): qty 2
  Filled 2012-07-13 (×2): qty 1
  Filled 2012-07-13: qty 2
  Filled 2012-07-13: qty 1
  Filled 2012-07-13 (×10): qty 2

## 2012-07-13 MED ORDER — ACETAMINOPHEN 325 MG PO TABS: 650.0000 mg | ORAL_TABLET | Freq: Four times a day (QID) | ORAL | Status: DC | PRN

## 2012-07-13 MED ORDER — SODIUM CHLORIDE 0.9 % IV SOLN
INTRAVENOUS | Status: AC
  Administered 2012-07-13 (×2): via INTRAVENOUS

## 2012-07-13 MED ORDER — ENOXAPARIN SODIUM 40 MG/0.4ML ~~LOC~~ SOLN
40.0000 mg | SUBCUTANEOUS | Status: DC
  Administered 2012-07-13 – 2012-07-25 (×13): 40 mg via SUBCUTANEOUS
  Filled 2012-07-13 (×13): qty 0.4

## 2012-07-13 MED ORDER — SODIUM CHLORIDE 0.9 % IJ SOLN
3.0000 mL | Freq: Two times a day (BID) | INTRAMUSCULAR | Status: DC
  Administered 2012-07-13 – 2012-07-25 (×22): 3 mL via INTRAVENOUS

## 2012-07-13 NOTE — Care Management Note (Addendum)
    Page 1 of 2   07/21/2012     1:20:59 PM   CARE MANAGEMENT NOTE 07/21/2012  Patient:  Kirk Townsend, Kirk Townsend   Account Number:  [id number]  Date Initiated:  07/13/2012  Documentation initiated by:  [facility]  Subjective/Objective Assessment:   ADMITTED W/N/V.HX:CAD,CHF.     Action/Plan:   FROM HOME W/SPOUSE.HAS PCP,PHARMACY.   Anticipated DC Date:  07/22/2012   Anticipated DC Plan:  SKILLED NURSING FACILITY      DC Planning Services  CM consult      Choice offered to / List presented to:  C-1 Patient           Status of service:  In process, will continue to follow Medicare Important Message given?  NA - LOS <3 / Initial given by admissions (If response is "NO", the following Medicare IM given date fields will be blank) Date Medicare IM given:   Date Additional Medicare IM given:  07/20/2012  Discharge Disposition:    Per UR Regulation:  Reviewed for med. necessity/level of care/duration of stay  If discussed at Long Length of Stay Meetings, dates discussed:   07/19/2012  07/21/2012    Comments:  07/21/12 Nalani Andreen RN,BSN NCM [phone] STILL WITH HICCUPS BUT ABLE TO EAT,REGLAN IV,GI CONS.D/C PLAN SNF.  07/19/12 Njeri Vicente RN,BSN NCM [phone] HAVING HICCUPS,NOT ABLE TO EAT.GI CONS.D/C PLAN SNF WHEN MED STABLE.  07/18/12 Nichols Corter RN,BSN NCM [phone] OT-SNF.  07/14/12 Kenni Newton RN,BSN NCM [phone] PROVIDED W/HHC AGENCY LIST AS RESOURCE.AWAIT PT RECOMMENDATIONS.  07/13/12 Kamrin Spath RN,BSN NCM [phone]

## 2012-07-13 NOTE — Progress Notes (Signed)
Echocardiogram 2D Echocardiogram has been performed.  Malu Pellegrini 07/13/2012, 4:11 PM

## 2012-07-13 NOTE — Progress Notes (Signed)
Patient ID: Kirk Townsend, male   DOB: 01/29/1929, 79 y.o.   MRN: [id number]  [facility] PROGRESS NOTE  ELIA KEENUM OZH:[id number] DOB: 03-28-1929 DOA: 07/12/2012 PCP: Londell Moh, MD  Brief narrative: Pt is a 79 y.o. male with history of CAD, status post angioplasty, narcolepsy, hypertension, CHF, chronic kidney disease, who was brought to the ER because of nausea and vomiting. Patient has been having hiccups for last 2 weeks, associated with poor oral intake and generalized malaise.  In the ER patient had CT abdomen and pelvis which showed features for possible gastroenteritis, cholelithiasis. Lipase elevated at 91.   Principal Problem:   Nausea & vomiting - still somewhat unclear etiology and possibly related to acute pancreatitis given elevated lipase on admission 91 - lipase is within normal limits today - on Abdominal US cholelithiasis noted - will continue supportive care for now as recommended by GI specialist, IVF, analgesia and antiemetics as needed Active Problems:   Chronic CHF, combined systolic and diastolic - last EF 45% (08/2011) - repeat 2 D ECHO - monitor daily weights, I's and O's   CKD (chronic kidney disease) - based on GFR stage II - III - creatinine is stable and within normal limits this AM - BMP in AM   Hiccoughs - thorazine as needed   Consultants:  GI consultation over the phone   Procedures/Studies: Dg Chest 2 View 07/12/2012  Stable COPD/emphysema and stable mild cardiomegaly.  No acute cardiopulmonary disease.    US Abdomen Complete 07/13/2012  Solitary gallstone.  No findings of cholecystitis.  Minimal prominence of the pancreatic duct, nonspecific.  Otherwise, normal appearing abdomen.   Ct Abdomen Pelvis W Contrast 07/12/2012   Cholelithiasis. Suggestion of small bowel wall thickening involving the jejunum and liquid stool in the colon.  Changes likely to represent gastroenteritis or other inflammatory process. Mild fatty  infiltration of the liver.  Antibiotics:  None  Code Status: Full Family Communication: Pt and wife at bedside Disposition Plan: Home when medically stable, will need PT evaluation prior to discharge   HPI/Subjective: No events overnight.   Objective: Filed Vitals:   07/12/12 2302 07/13/12 0140 07/13/12 0534 07/13/12 1252  BP: 150/62 154/66 164/63 165/75  Pulse: 71 72 76 67  Temp:  100.6 F (38.1 C) 99.6 F (37.6 C) 99.2 F (37.3 C)  TempSrc:  Oral Oral Oral  Resp: 15 16 16 18   Height:  5\' 7"  (1.702 m)    Weight:  65.3 kg (143 lb 15.4 oz)    SpO2: 97% 92% 96% 96%    Intake/Output Summary (Last 24 hours) at 07/13/12 1509 Last data filed at 07/13/12 1030  Gross per 24 hour  Intake  482.5 ml  Output    300 ml  Net  182.5 ml    Exam:   General:  Pt is alert, follows commands appropriately, not in acute distress  Cardiovascular: Regular rate and rhythm, S1/S2, no murmurs, no rubs, no gallops  Respiratory: Clear to auscultation bilaterally, no wheezing, no crackles, no rhonchi  Abdomen: Soft, non tender, non distended, bowel sounds present, no guarding  Extremities: No edema, pulses DP and PT palpable bilaterally  Neuro: Grossly nonfocal  Data Reviewed: Basic Metabolic Panel:  Recent Labs Lab 07/12/12 1910 07/13/12 0210  NA 132* 132*  K 3.8 4.0  CL 97 99  CO2 27 24  GLUCOSE 118* 104*  BUN 20 19  CREATININE 1.32 1.21  CALCIUM 8.6 8.4   Liver Function Tests:  Recent Labs  Lab 07/12/12 1910 07/13/12 0210  AST 29 30  ALT 18 19  ALKPHOS 69 65  BILITOT 0.3 0.3  PROT 5.9* 5.8*  ALBUMIN 3.1* 3.1*    Recent Labs Lab 07/12/12 1910 07/13/12 0210  LIPASE 91* 38   CBC:  Recent Labs Lab 07/12/12 1910 07/13/12 0210  WBC 6.0 5.8  NEUTROABS 5.0  --   HGB 13.4 13.9  HCT 40.1 41.6  MCV 99.0 99.0  PLT 204 182   Cardiac Enzymes:  Recent Labs Lab 07/12/12 1910 07/13/12 0209 07/13/12 0843 07/13/12 1345  TROPONINI <0.30 <0.30 <0.30 <0.30    Scheduled Meds: . amphetamine-dextroam  20 mg Oral BID AC  . Armodafinil  250 mg Oral Daily  . aspirin  81 mg Oral Daily  . bisoprolol  5 mg Oral Daily  . buPROPion  150 mg Oral Daily  . donepezil  5 mg Oral QHS  . enoxaparin injection  40 mg Subcutaneous Q24H  . pantoprazole  40 mg Oral Daily  . sertraline  50 mg Oral Daily  . simvastatin  20 mg Oral QPM  . Sodium Oxybate  4,000 mg Oral Custom  . vitamin C  500 mg Oral Daily   Continuous Infusions: . sodium chloride 50 mL/hr at 07/13/12 0209     Debbora Presto, MD  [facility] Pager [phone]  If 7PM-7AM, please contact night-coverage [email or web address] Password [email or web address] 07/13/2012, 3:09 PM   LOS: 1 day

## 2012-07-13 NOTE — Progress Notes (Signed)
INITIAL NUTRITION ASSESSMENT  DOCUMENTATION CODES Per approved criteria  -Not Applicable   INTERVENTION: Advance diet per MD discretion Provide Resource Breeze po BID, each supplement provides 250 kcal and 9 grams of protein. Provide Multivitamin with minerals daily  NUTRITION DIAGNOSIS: Unintentional wt loss related to nausea, vomiting and hiccups as evidenced by 5% wt loss in 2 weeks.   Goal: Pt to meet >/= 90% of their estimated nutrition needs  Monitor:  Po intake Wt  Reason for Assessment: MST  79 y.o. male  Admitting Dx: Nausea & vomiting  ASSESSMENT: 79 year old male with history of CAD, status post angioplasty, narcolepsy, hypertension, CHF, chronic kidney disease, who was brought to the ER because of nausea and vomiting. Patient has been having hiccups for last 2 weeks, associated with poor oral intake and generalized malaise. In the ER patient had CT abdomen and pelvis which showed features for possible gastroenteritis, cholelithiasis. Pt asleep at time of visit, unable to awaken. Wife at bedside reports pt has had difficulty eating for the past 2 weeks due to hiccups and then N/V. Wife reports pt usually weighs around 150 lbs and eats 4 small meals daily with Boost supplements occasionally; follows a low salt diet. Per wife pt's nausea and hiccups have improved with medications and pt has had no vomiting episodes so far today. Pt is tolerating clear liquids.  Height: Ht Readings from Last 1 Encounters:  07/13/12 5\' 7"  (1.702 m)    Weight: Wt Readings from Last 1 Encounters:  07/13/12 143 lb 15.4 oz (65.3 kg)    Ideal Body Weight: 148 lbs  % Ideal Body Weight: 97%  Wt Readings from Last 10 Encounters:  07/13/12 143 lb 15.4 oz (65.3 kg)  09/03/11 152 lb 11.2 oz (69.264 kg)    Usual Body Weight: 150 lbs  % Usual Body Weight: 95%  BMI:  Body mass index is 22.54 kg/(m^2).  Estimated Nutritional Needs: Kcal: 1630-1830 Protein: 65-78 grams Fluid: 1.9  L  Skin: dry, flaky  Diet Order: Clear Liquid  EDUCATION NEEDS: -No education needs identified at this time   Intake/Output Summary (Last 24 hours) at 07/13/12 1533 Last data filed at 07/13/12 1030  Gross per 24 hour  Intake  482.5 ml  Output    300 ml  Net  182.5 ml    Last BM: 3/18  Labs:   Recent Labs Lab 07/12/12 1910 07/13/12 0210  NA 132* 132*  K 3.8 4.0  CL 97 99  CO2 27 24  BUN 20 19  CREATININE 1.32 1.21  CALCIUM 8.6 8.4  GLUCOSE 118* 104*    CBG (last 3)  No results found for this basename: GLUCAP,  in the last 72 hours  Scheduled Meds: . amphetamine-dextroamphetamine  20 mg Oral BID AC  . Armodafinil  250 mg Oral Daily  . aspirin  81 mg Oral Daily  . bisoprolol  5 mg Oral Daily  . buPROPion  150 mg Oral Daily  . donepezil  5 mg Oral QHS  . enoxaparin (LOVENOX) injection  40 mg Subcutaneous Q24H  . pantoprazole  40 mg Oral Daily  . sertraline  50 mg Oral Daily  . sodium chloride  3 mL Intravenous Q12H  . Sodium Oxybate  4,000 mg Oral Custom    Continuous Infusions: . sodium chloride 50 mL/hr at 07/13/12 0209    Past Medical History  Diagnosis Date  . Coronary artery disease   . Hypertension   . Renal disorder   .  High cholesterol   . Narcolepsy   . Myocardial infarction     1994  . Barrett's esophagus     Past Surgical History  Procedure Laterality Date  . Hernia repair    . Coronary angioplasty      1994. No stent.    Ian Malkin RD, LDN Inpatient Clinical Dietitian Pager: [phone] After Hours Pager: [phone]

## 2012-07-14 ENCOUNTER — Inpatient Hospital Stay (HOSPITAL_COMMUNITY): Payer: Medicare Other

## 2012-07-14 LAB — CBC
MCV: 98.8 fL (ref 78.0–100.0)
Platelets: 172 10*3/uL (ref 150–400)
RBC: 4.08 MIL/uL — ABNORMAL LOW (ref 4.22–5.81)
WBC: 6.9 10*3/uL (ref 4.0–10.5)

## 2012-07-14 LAB — LIPASE, BLOOD: Lipase: 26 U/L (ref 11–59)

## 2012-07-14 LAB — BASIC METABOLIC PANEL
CO2: 21 mEq/L (ref 19–32)
Chloride: 103 mEq/L (ref 96–112)
Potassium: 3.7 mEq/L (ref 3.5–5.1)
Sodium: 135 mEq/L (ref 135–145)

## 2012-07-14 MED ORDER — HYDRALAZINE HCL 20 MG/ML IJ SOLN
10.0000 mg | Freq: Once | INTRAMUSCULAR | Status: AC
  Administered 2012-07-14: 10 mg via INTRAVENOUS
  Filled 2012-07-14: qty 1

## 2012-07-14 MED ORDER — HYDRALAZINE HCL 20 MG/ML IJ SOLN
5.0000 mg | Freq: Four times a day (QID) | INTRAMUSCULAR | Status: DC | PRN
  Administered 2012-07-15 – 2012-07-16 (×3): 5 mg via INTRAVENOUS
  Filled 2012-07-14 (×3): qty 1

## 2012-07-14 NOTE — Progress Notes (Signed)
Spent a long time explaining test and plan of care to wife, she is at bedside tearful. She states she has macular degeneration and sees very poorly. Reassured. Ordered wife food trays as she states she has not eaten in two days. She states they have food in the house but just hasn't felt like eating. She states her dgt lives in [place] and his dgt in [place], [place]. Encouraged her to call children for support. Also gave her cereal and milk. Case management to see pt as well.

## 2012-07-14 NOTE — Progress Notes (Signed)
Pt able to partially open eyes.  Can hold up right arm but left arm drifts.  Still unable to converse with wife.  Did answer no to my question if his head still hurt.  Airway clear and otherwise stable. Nino Parsley

## 2012-07-14 NOTE — Progress Notes (Signed)
Pt returned to baseline other than sleepy.  Able to converse with RN and wife.  No difficulty with fluids or medications. Nino Parsley'

## 2012-07-14 NOTE — Evaluation (Signed)
Clinical/Bedside Swallow Evaluation Patient Details  Name: Kirk Townsend MRN: [id number] Date of Birth: February 03, 1929  Today's Date: 07/14/2012 Time: 4098-1191 SLP Time Calculation (min): 12 min  Past Medical History:  Past Medical History  Diagnosis Date  . Coronary artery disease   . Hypertension   . Renal disorder   . High cholesterol   . Narcolepsy   . Myocardial infarction     1994  . Barrett's esophagus    Past Surgical History:  Past Surgical History  Procedure Laterality Date  . Hernia repair    . Coronary angioplasty      1994. No stent.   HPI:  79 yo adm to [facility] 07-12-12 with N/V, hiccups x2 weeks,  poor po intake and malaise.  PMH + for CHF, CAD, CKD, narcolepsy.  Pt CT abdomen showed ? gastroenteritis, choleithiasis.  CXR indicated stable COPD, emphysema.  Pt had episode of AMS, lethargy today.  He had a brain MRI - negative for acute change.     Assessment / Plan / Recommendation Clinical Impression  Pt presents with functional oropharyngeal swallow from bedside assessment, no clinical indications of aspiration/penetration.  Observed pt to masticate cookie and consume boost.  Pt does admit to chronic mild shortness of breath x6 months- noted minimal incr work of breathing with po.  SLP advised him and his family to aspiration precautions with dyspnea due to episodic aspiration risk. Pt reports taking his pills one at a time with good tolerance.  No further slp indicated as all education is complete.       Aspiration Risk  Mild    Diet Recommendation Regular;Thin liquid   Medication Administration: Whole meds with liquid Supervision: Patient able to self feed Compensations: Slow rate;Small sips/bites Postural Changes and/or Swallow Maneuvers: Seated upright 90 degrees;Upright 30-60 min after meal    Other  Recommendations Oral Care Recommendations: Oral care BID   Follow Up Recommendations  None    Frequency and Duration        Pertinent Vitals/Pain Low grade  temp 99.3, CTA    SLP Swallow Goals  n/a   Swallow Study Prior Functional Status   fed self regular/thin with good tolerance    General Date of Onset: 07/14/12 HPI: 79 yo adm to [facility] 07-12-12 with N/V, hiccups x2 weeks,  poor po intake and malaise.  PMH + for CHF, CAD, CKD, narcolepsy.  Pt CT abdomen showed ? gastroenteritis, choleithiasis.  CXR indicated stable COPD, emphysema.  Pt had episode of AMS, lethargy today.  He had a brain MRI - negative for acute change.   Type of Study: Bedside swallow evaluation Previous Swallow Assessment: none Diet Prior to this Study: Regular;Thin liquids Temperature Spikes Noted: No Respiratory Status: Room air History of Recent Intubation: No Behavior/Cognition: Alert;Cooperative;Pleasant mood Oral Cavity - Dentition: Dentures, top;Adequate natural dentition Self-Feeding Abilities: Able to feed self Patient Positioning: Upright in bed Baseline Vocal Quality: Clear Volitional Cough: Strong Volitional Swallow: Able to elicit    Oral/Motor/Sensory Function Overall Oral Motor/Sensory Function: Appears within functional limits for tasks assessed (pt has green coating on his tongue, ? source) Labial ROM: Within Functional Limits Labial Symmetry: Within Functional Limits Labial Strength: Within Functional Limits Labial Sensation: Within Functional Limits Lingual ROM: Within Functional Limits Lingual Symmetry: Within Functional Limits Lingual Strength: Within Functional Limits Lingual Sensation: Within Functional Limits Facial ROM: Reduced right (? mildly on right) Facial Sensation: Within Functional Limits Velum: Within Functional Limits Mandible: Within Functional Limits   Ice Chips  Thin Liquid Thin Liquid: Within functional limits Presentation: Self Fed;Straw    Nectar Thick Nectar Thick Liquid: Not tested   Honey Thick Honey Thick Liquid: Not tested   Puree Puree: Not tested   Solid   GO    Solid: Within functional  limits Presentation: Self Lisabeth Pick, MS [facility] SLP [phone]

## 2012-07-14 NOTE — Progress Notes (Signed)
Pt's wife reported that pt woke up and complained of headache.  Upon entering room, pt unable to squeeze hands and speech garbled.  Could not open eyes.  Dr. Izola Price notified.

## 2012-07-14 NOTE — Progress Notes (Signed)
Patient ID: Kirk Townsend, male   DOB: Sep 17, 1928, 79 y.o.   MRN: [id number] [facility] PROGRESS NOTE  JUANANGEL Townsend WUJ:[id number] DOB: 1928-12-13 DOA: 07/12/2012 PCP: Londell Moh, MD  Brief narrative:  Pt is a 79 y.o. male with history of CAD, status post angioplasty, narcolepsy, hypertension, CHF, chronic kidney disease, who was brought to the ER because of nausea and vomiting. Patient has been having hiccups for last 2 weeks, associated with poor oral intake and generalized malaise.   In the ER patient had CT abdomen and pelvis which showed features for possible gastroenteritis, cholelithiasis. Lipase elevated at 91.   Principal Problem:  Nausea & vomiting  - still somewhat unclear etiology and possibly related to acute pancreatitis given elevated lipase on admission 91  - pt reports feeling better this AM and lipase is now down and within normal limits - on Abdominal US cholelithiasis noted  - will continue supportive care for now as recommended by GI specialist, IVF, analgesia and antiemetics as needed  - advance diet to regular  Active Problems:  Slurred speech this AM - will obtain MRI brain, rule out CVA Chronic CHF, combined systolic and diastolic  - last EF 45% (08/2011)  - repeat 2 D ECHO indicating EF 50% (07/13/2012) - monitor daily weights, I's and O's  CKD (chronic kidney disease)  - based on GFR stage II - III  - creatinine is stable and within normal limits this AM  - BMP in AM  Hiccoughs  - thorazine as needed helps with symptom control, continue same regimen   Consultants:  GI consultation over the phone  Procedures/Studies:  Dg Chest 2 View 07/12/2012  Stable COPD/emphysema and stable mild cardiomegaly. No acute cardiopulmonary disease.  US Abdomen Complete 07/13/2012  Solitary gallstone. No findings of cholecystitis. Minimal prominence of the pancreatic duct, nonspecific. Otherwise, normal appearing abdomen.  Ct Abdomen Pelvis W Contrast  07/12/2012  Cholelithiasis. Suggestion of small bowel wall thickening involving the jejunum and liquid stool in the colon. Changes likely to represent gastroenteritis or other inflammatory process. Mild fatty infiltration of the liver.  Antibiotics:  None  Code Status: Full  Family Communication: Pt and wife at bedside  Disposition Plan: Home when medically stable, will need PT evaluation prior to discharge   HPI/Subjective: No events overnight.   Objective: Filed Vitals:   07/13/12 2100 07/14/12 0512 07/14/12 0615 07/14/12 1011  BP: 151/62 195/87 182/87 169/61  Pulse: 66 72 80 89  Temp: 98.1 F (36.7 C) 99.3 F (37.4 C)    TempSrc: Oral Oral    Resp: 16 16    Height:      Weight:  66.316 kg (146 lb 3.2 oz)    SpO2: 98% 94%      Intake/Output Summary (Last 24 hours) at 07/14/12 1148 Last data filed at 07/14/12 0900  Gross per 24 hour  Intake   1560 ml  Output    450 ml  Net   1110 ml    Exam:   General:  Pt is somnolent but follows commands appropriately, not in acute distress, very weak and fragile appearing   Cardiovascular: Regular rate and rhythm, S1/S2, no murmurs, no rubs, no gallops  Respiratory: Clear to auscultation bilaterally, decreased breath sounds at bases   Abdomen: Soft, non tender, non distended, bowel sounds present, no guarding  Extremities: No edema, pulses DP and PT palpable bilaterally  Neuro: Grossly nonfocal except slurred speech  Data Reviewed: Basic Metabolic Panel:  Recent Labs Lab  07/12/12 1910 07/13/12 0210 07/14/12 0415  NA 132* 132* 135  K 3.8 4.0 3.7  CL 97 99 103  CO2 27 24 21   GLUCOSE 118* 104* 84  BUN 20 19 18   CREATININE 1.32 1.21 1.24  CALCIUM 8.6 8.4 8.6   Liver Function Tests:  Recent Labs Lab 07/12/12 1910 07/13/12 0210  AST 29 30  ALT 18 19  ALKPHOS 69 65  BILITOT 0.3 0.3  PROT 5.9* 5.8*  ALBUMIN 3.1* 3.1*    Recent Labs Lab 07/12/12 1910 07/13/12 0210 07/14/12 0415  LIPASE 91* 38 26    CBC:  Recent Labs Lab 07/12/12 1910 07/13/12 0210 07/14/12 0415  WBC 6.0 5.8 6.9  NEUTROABS 5.0  --   --   HGB 13.4 13.9 13.4  HCT 40.1 41.6 40.3  MCV 99.0 99.0 98.8  PLT 204 182 172   Cardiac Enzymes:  Recent Labs Lab 07/12/12 1910 07/13/12 0209 07/13/12 0843 07/13/12 1345  TROPONINI <0.30 <0.30 <0.30 <0.30   Scheduled Meds: . amphetamine-dextroamphetamine  20 mg Oral BID AC  . Armodafinil  250 mg Oral Daily  . aspirin  81 mg Oral Daily  . bisoprolol  5 mg Oral Daily  . buPROPion  150 mg Oral Daily  . donepezil  5 mg Oral QHS  . enoxaparin (LOVENOX) injection  40 mg Subcutaneous Q24H  . feeding supplement  1 Container Oral BID BM  . multivitamin with minerals  1 tablet Oral Daily  . pantoprazole  40 mg Oral Daily  . sertraline  50 mg Oral Daily  . sodium chloride  3 mL Intravenous Q12H  . Sodium Oxybate  4,000 mg Oral Custom   Continuous Infusions:    Debbora Presto, MD  [facility] Pager [phone]  If 7PM-7AM, please contact night-coverage [email or web address] Password [facility] 07/14/2012, 11:48 AM   LOS: 2 days

## 2012-07-15 ENCOUNTER — Inpatient Hospital Stay (HOSPITAL_COMMUNITY): Payer: Medicare Other

## 2012-07-15 LAB — CBC
HCT: 38.8 % — ABNORMAL LOW (ref 39.0–52.0)
Hemoglobin: 13.1 g/dL (ref 13.0–17.0)
RBC: 4.06 MIL/uL — ABNORMAL LOW (ref 4.22–5.81)
WBC: 7.6 10*3/uL (ref 4.0–10.5)

## 2012-07-15 LAB — BASIC METABOLIC PANEL
BUN: 15 mg/dL (ref 6–23)
Chloride: 99 mEq/L (ref 96–112)
Glucose, Bld: 114 mg/dL — ABNORMAL HIGH (ref 70–99)
Potassium: 3.3 mEq/L — ABNORMAL LOW (ref 3.5–5.1)
Sodium: 132 mEq/L — ABNORMAL LOW (ref 135–145)

## 2012-07-15 MED ORDER — FUROSEMIDE 10 MG/ML IJ SOLN
INTRAMUSCULAR | Status: AC
  Filled 2012-07-15: qty 4

## 2012-07-15 MED ORDER — FUROSEMIDE 10 MG/ML IJ SOLN
20.0000 mg | Freq: Once | INTRAMUSCULAR | Status: AC
  Administered 2012-07-15: 20 mg via INTRAVENOUS
  Filled 2012-07-15: qty 2

## 2012-07-15 MED ORDER — LORAZEPAM 2 MG/ML IJ SOLN: 1.0000 mg | Freq: Two times a day (BID) | INTRAMUSCULAR | Status: DC | PRN

## 2012-07-15 MED ORDER — POTASSIUM CHLORIDE CRYS ER 20 MEQ PO TBCR
40.0000 meq | EXTENDED_RELEASE_TABLET | Freq: Once | ORAL | Status: AC
  Administered 2012-07-15: 40 meq via ORAL
  Filled 2012-07-15: qty 2

## 2012-07-15 NOTE — Progress Notes (Signed)
Patient complaining of shortness of breath this pm with exertion. Oxygen saturation was 92% on room air, HR 92, afebrile, RR 22 and labored. Nasal cannula 2L applied to patient to achieve saturation of 95 %. BP stable, crackles bilaterally in lower lobes of lungs. MD notified. New orders placed for chest xray and IV lasix. Will continue to closely monitor patient.Kirk Townsend, Don Broach

## 2012-07-15 NOTE — Progress Notes (Signed)
OT Cancellation Note  Patient Details Name: Kirk Townsend MRN: [id number] DOB: 1928-09-10   Cancelled Treatment:     Pt very limited with PT eval today and exhausted.  Will check back tomorrow as schedule permits.    [name] 07/15/2012, 3:14 PM Marica Otter, OTR/L [phone] 07/15/2012

## 2012-07-15 NOTE — Evaluation (Signed)
Physical Therapy Evaluation Patient Details Name: Kirk Townsend MRN: [id number] DOB: February 19, 1929 Today's Date: 07/15/2012 Time: 1357-1410 PT Time Calculation (min): 13 min  PT Assessment / Plan / Recommendation Clinical Impression  Pt admitted for nausea and vomiting and now c/o generalized weakness.  Upon standing, pt reported he couldn't tolerate ambulation today.  Pt would benefit from acute PT services in order to improve independence with transfers and ambulation to prepare for d/c to next venue.  Spouse reports she could not manage pt at current level however feels he may progress to home.    PT Assessment  Patient needs continued PT services    Follow Up Recommendations  Supervision/Assistance - 24 hour;SNF (may progress to HHPT)    Does the patient have the potential to tolerate intense rehabilitation      Barriers to Discharge        Equipment Recommendations  None recommended by PT    Recommendations for Other Services     Frequency Min 3X/week    Precautions / Restrictions Precautions Precautions: Fall   Pertinent Vitals/Pain n/a      Mobility  Bed Mobility Bed Mobility: Supine to Sit;Sit to Supine Supine to Sit: 5: Supervision Sit to Supine: 5: Supervision Details for Bed Mobility Assistance: increased time and effort Transfers Transfers: Sit to Stand;Stand to Sit Sit to Stand: 4: Min assist;With upper extremity assist;From bed Stand to Sit: 4: Min assist;To bed Details for Transfer Assistance: upon standing pt reports feeling weak and wobbly and asked to "postpone" ambulation so assist pt back to supine Ambulation/Gait Ambulation/Gait Assistance: Not tested (comment)   Pt did reports SOB with standing however SaO2 96% room air and HR 82 upon sitting down on bed.  Exercises     PT Diagnosis: Difficulty walking;Generalized weakness  PT Problem List: Decreased strength;Decreased activity tolerance;Decreased mobility;Decreased knowledge of use of DME PT  Treatment Interventions: DME instruction;Gait training;Functional mobility training;Therapeutic activities;Patient/family education   PT Goals Acute Rehab PT Goals PT Goal Formulation: With patient Time For Goal Achievement: 07/22/12 Potential to Achieve Goals: Good Pt will go Sit to Stand: with modified independence PT Goal: Sit to Stand - Progress: Goal set today Pt will go Stand to Sit: with modified independence PT Goal: Stand to Sit - Progress: Goal set today Pt will Ambulate: >150 feet;with modified independence;with least restrictive assistive device PT Goal: Ambulate - Progress: Goal set today Pt will Perform Home Exercise Program: with supervision, verbal cues required/provided PT Goal: Perform Home Exercise Program - Progress: Goal set today  Visit Information  Last PT Received On: 07/15/12 Assistance Needed: +2    Subjective Data  Subjective: How about we postpone this (did not want to ambulate after standing up)   Prior Functioning  Home Living Lives With: Spouse Type of Home: House (townhome) Home Access: Level entry Home Layout: One level Home Adaptive Equipment: Straight cane;Walker - rolling Prior Function Level of Independence: Independent Communication Communication: No difficulties    Cognition  Cognition Overall Cognitive Status: Appears within functional limits for tasks assessed/performed Arousal/Alertness: Awake/alert Orientation Level: Appears intact for tasks assessed Behavior During Session: [facility] for tasks performed    Extremity/Trunk Assessment Right Lower Extremity Assessment RLE ROM/Strength/Tone: Deficits RLE ROM/Strength/Tone Deficits: grossly 3/5, pt did not wish to perform MMT however states "my doctor does this all time" and states "good" strength however currently reports increased weakness Left Lower Extremity Assessment LLE ROM/Strength/Tone: Deficits LLE ROM/Strength/Tone Deficits: grossly 3/5, pt did not wish to perform MMT however  states "  my doctor does this all time" and states "good" strength however currently reports increased weakness   Balance    End of Session PT - End of Session Activity Tolerance: Patient limited by fatigue Patient left: in bed;with call bell/phone within reach;with bed alarm set;with family/visitor present  GP     Cissy Galbreath,KATHrine E 07/15/2012, 3:51 PM Zenovia Jarred, PT, DPT 07/15/2012 Pager: [phone]

## 2012-07-15 NOTE — Progress Notes (Signed)
Patient ID: JAMARIUS SAHA, male   DOB: November 20, 1928, 79 y.o.   MRN: [id number]  [facility] PROGRESS NOTE  WEYLIN PLAGGE WUJ:[id number] DOB: 11-22-1928 DOA: 07/12/2012 PCP: Londell Moh, MD  Brief narrative:  Pt is a 79 y.o. male with history of CAD, status post angioplasty, narcolepsy, hypertension, CHF, chronic kidney disease, who was brought to the ER because of nausea and vomiting. Patient has been having hiccups for last 2 weeks, associated with poor oral intake and generalized malaise.   In the ER patient had CT abdomen and pelvis which showed features for possible gastroenteritis, cholelithiasis. Lipase elevated at 91.   Principal Problem:  Nausea & vomiting  - still somewhat unclear etiology and possibly related to acute pancreatitis given elevated lipase on admission 91  - pt reports feeling better this AM and lipase is now down and within normal limits, patient reports feeling better and is currently eating and reports tolerating diet well  - on Abdominal US cholelithiasis noted  - will continue supportive care for now as recommended by GI specialist, IVF, analgesia and antiemetics as needed  - Recommendation is to proceed with regular diet, thin liquid  Active Problems:  Slurred speech this AM  - MRI of the brain negative for acute stroke, however old progressive chronic ischemic changes noted - pt is at baseline mental status this AM Chronic CHF, combined systolic and diastolic  - last EF 45% (08/2011) - pt is clinically stable from that perspective  - repeat 2 D ECHO indicating EF 50% (07/13/2012)  - monitor daily weights, I's and O's  CKD (chronic kidney disease)  - based on GFR stage II - III  - creatinine is stable and within normal limits this AM  - BMP in AM  Hiccoughs  - thorazine as needed helps with symptom control, continue same regimen  Hypokalemia - Mild, will supplement as indicated, BMP in the morning  Consultants:  GI consultation over the  phone  Procedures/Studies:  Dg Chest 2 View 07/12/2012  Stable COPD/emphysema and stable mild cardiomegaly. No acute cardiopulmonary disease.  US Abdomen Complete 07/13/2012  Solitary gallstone. No findings of cholecystitis. Minimal prominence of the pancreatic duct, nonspecific. Otherwise, normal appearing abdomen.  Ct Abdomen Pelvis W Contrast 07/12/2012  Cholelithiasis. Suggestion of small bowel wall thickening involving the jejunum and liquid stool in the colon. Changes likely to represent gastroenteritis or other inflammatory process. Mild fatty infiltration of the liver.  Antibiotics:  None  Code Status: Full  Family Communication: Pt and wife at bedside  Disposition Plan: Home when medically stable, PT evaluation pending  HPI/Subjective: No events overnight. Patient reports feeling better and back to his baseline but still overall weak.  Objective: Filed Vitals:   07/14/12 2249 07/15/12 0100 07/15/12 0651 07/15/12 1031  BP: 168/82 164/83 179/78 179/74  Pulse:  74 83   Temp:   99.1 F (37.3 C)   TempSrc:   Oral   Resp:   20   Height:      Weight:   66.5 kg (146 lb 9.7 oz)   SpO2:   94%     Intake/Output Summary (Last 24 hours) at 07/15/12 1105 Last data filed at 07/15/12 0500  Gross per 24 hour  Intake    720 ml  Output    925 ml  Net   -205 ml    Exam:   General:  Pt is alert, follows commands appropriately, not in acute distress, fragile appearing   Cardiovascular: Regular rate and  rhythm, S1/S2, no murmurs, no rubs, no gallops  Respiratory: Clear to auscultation bilaterally, no wheezing, no crackles, no rhonchi, decreased breath sounds at bases   Abdomen: Soft, non tender, non distended, bowel sounds present, no guarding  Extremities: No edema, pulses DP and PT palpable bilaterally  Neuro: Grossly nonfocal  Data Reviewed: Basic Metabolic Panel:  Recent Labs Lab 07/12/12 1910 07/13/12 0210 07/14/12 0415 07/15/12 0417  NA 132* 132* 135 132*  K 3.8  4.0 3.7 3.3*  CL 97 99 103 99  CO2 27 24 21 23   GLUCOSE 118* 104* 84 114*  BUN 20 19 18 15   CREATININE 1.32 1.21 1.24 1.21  CALCIUM 8.6 8.4 8.6 8.7   Liver Function Tests:  Recent Labs Lab 07/12/12 1910 07/13/12 0210  AST 29 30  ALT 18 19  ALKPHOS 69 65  BILITOT 0.3 0.3  PROT 5.9* 5.8*  ALBUMIN 3.1* 3.1*    Recent Labs Lab 07/12/12 1910 07/13/12 0210 07/14/12 0415  LIPASE 91* 38 26   CBC:  Recent Labs Lab 07/12/12 1910 07/13/12 0210 07/14/12 0415 07/15/12 0417  WBC 6.0 5.8 6.9 7.6  NEUTROABS 5.0  --   --   --   HGB 13.4 13.9 13.4 13.1  HCT 40.1 41.6 40.3 38.8*  MCV 99.0 99.0 98.8 95.6  PLT 204 182 172 193   Cardiac Enzymes:  Recent Labs Lab 07/12/12 1910 07/13/12 0209 07/13/12 0843 07/13/12 1345  TROPONINI <0.30 <0.30 <0.30 <0.30    Scheduled Meds: . amphetamine-dextroamphetamine  20 mg Oral BID AC  . Armodafinil  250 mg Oral Daily  . aspirin  81 mg Oral Daily  . bisoprolol  5 mg Oral Daily  . buPROPion  150 mg Oral Daily  . donepezil  5 mg Oral QHS  . enoxaparin  injection  40 mg Subcutaneous Q24H  . Multivitamin minerals  1 tablet Oral Daily  . pantoprazole  40 mg Oral Daily  . sertraline  50 mg Oral Daily  . Sodium Oxybate  4,000 mg Oral Custom   Continuous Infusions:    Debbora Presto, MD  [facility] Pager [phone]  If 7PM-7AM, please contact night-coverage [email or web address] Password [email or web address] 07/15/2012, 11:05 AM   LOS: 3 days

## 2012-07-16 LAB — CBC
HCT: 39 % (ref 39.0–52.0)
Hemoglobin: 13.2 g/dL (ref 13.0–17.0)
MCH: 31.8 pg (ref 26.0–34.0)
MCHC: 33.8 g/dL (ref 30.0–36.0)
RBC: 4.15 MIL/uL — ABNORMAL LOW (ref 4.22–5.81)

## 2012-07-16 LAB — BASIC METABOLIC PANEL
BUN: 17 mg/dL (ref 6–23)
CO2: 22 mEq/L (ref 19–32)
GFR calc non Af Amer: 55 mL/min — ABNORMAL LOW (ref >90)
Glucose, Bld: 108 mg/dL — ABNORMAL HIGH (ref 70–99)
Potassium: 3.4 mEq/L — ABNORMAL LOW (ref 3.5–5.1)

## 2012-07-16 MED ORDER — HYDRALAZINE HCL 20 MG/ML IJ SOLN: 10.0000 mg | INTRAMUSCULAR | Status: DC | PRN

## 2012-07-16 MED ORDER — VANCOMYCIN HCL IN DEXTROSE 1-5 GM/200ML-% IV SOLN
1000.0000 mg | Freq: Every day | INTRAVENOUS | Status: DC
  Administered 2012-07-16 – 2012-07-18 (×3): 1000 mg via INTRAVENOUS
  Filled 2012-07-16 (×3): qty 200

## 2012-07-16 MED ORDER — LEVOFLOXACIN IN D5W 750 MG/150ML IV SOLN
750.0000 mg | Freq: Every day | INTRAVENOUS | Status: DC
  Administered 2012-07-16: 750 mg via INTRAVENOUS
  Filled 2012-07-16: qty 150

## 2012-07-16 MED ORDER — DEXTROSE 5 % IV SOLN
1.0000 g | Freq: Three times a day (TID) | INTRAVENOUS | Status: DC
  Administered 2012-07-16: 1 g via INTRAVENOUS
  Filled 2012-07-16 (×3): qty 1

## 2012-07-16 MED ORDER — POTASSIUM CHLORIDE CRYS ER 20 MEQ PO TBCR
40.0000 meq | EXTENDED_RELEASE_TABLET | Freq: Once | ORAL | Status: DC
  Filled 2012-07-16: qty 2

## 2012-07-16 MED ORDER — LEVOFLOXACIN IN D5W 750 MG/150ML IV SOLN
750.0000 mg | INTRAVENOUS | Status: AC
  Administered 2012-07-18: 750 mg via INTRAVENOUS
  Filled 2012-07-16: qty 150

## 2012-07-16 MED ORDER — DEXTROSE 5 % IV SOLN
1.0000 g | Freq: Two times a day (BID) | INTRAVENOUS | Status: DC
  Administered 2012-07-16 – 2012-07-18 (×4): 1 g via INTRAVENOUS
  Filled 2012-07-16 (×5): qty 1

## 2012-07-16 NOTE — Progress Notes (Signed)
Patient ID: Kirk Townsend, male   DOB: 07-21-1928, 79 y.o.   MRN: [id number]  [facility] PROGRESS NOTE  Kirk Townsend JYN:[id number] DOB: [birth year] DOA: 07/12/2012 PCP: Londell Moh, MD  Brief narrative: Pt is a 79 y.o. male with history of CAD, status post angioplasty, narcolepsy, hypertension, CHF, chronic kidney disease, who was brought to the ER because of nausea and vomiting. Patient has been having hiccups for last 2 weeks, associated with poor oral intake and generalized malaise. On 03/21, pt has developed acute respiratory failure with hypoxia and CXR has demonstrated development on right upper lobe PNA with development of CHF. Pt started on broad spectrum ABX to cover for presumptive HCAP. One dose of Lasix IV given 07/15/2012.   In the ER patient had CT abdomen and pelvis which showed features for possible gastroenteritis, cholelithiasis. Lipase elevated at 91.   Principal Problem:  Acute hypoxic respiratory failure - this is likely multifactorial and secondary to development of PNA and CHF - continue broad spectrum ABX to cover for presumptive HCAP - one dose of Lasix given 03/21, will hold of on Lasix for now due to overall euvolemic state and hyponatremia - will monitor I's and O's and daily weights Nausea & vomiting  - possibly related to acute pancreatitis given elevated lipase on admission 91  - pt reports feeling better this AM and lipase is now down and within normal limits - on Abdominal US cholelithiasis noted  - will continue supportive care for now as recommended by GI specialist, IVF, analgesia and antiemetics as needed  - continue regular diet  Active Problems:  Slurred speech 03/20 - MRI of the brain negative for acute stroke, however old progressive chronic ischemic changes noted  - pt is at baseline mental status and overall non focal physical exam  Chronic CHF, combined systolic and diastolic  - last EF 45% (08/2011)  - repeat 2 D ECHO  indicating EF 50% (07/13/2012)  - monitor daily weights, I's and O's  CKD (chronic kidney disease)  - based on GFR stage II - III  - creatinine is stable and within normal limits this AM  - BMP in AM  Hiccoughs  - thorazine as needed helps with symptom control, continue same regimen  Hypokalemia  - Mild, will supplement as indicated, BMP in the morning   Consultants:  GI consultation over the phone  Procedures/Studies:  Mr Brain Wo Contrast 07/14/2012   No acute infarct. No acute intracranial abnormality identified. Cerebral volume loss and mild to moderate nonspecific cerebral white matter signal changes.The examination had to be discontinued prior to completion.  Dg Chest Port 1 View  07/15/2012   Asymmetric airspace consolidation in the right upper lobe concerning for pneumonia. Cardiomegaly with cephalization of the pulmonary vasculature and diffuse interstitial prominence suggestive of background of mild interstitial pulmonary edema, possibly related to congestive heart failure.  Dg Chest 2 View 07/12/2012  Stable COPD/emphysema and stable mild cardiomegaly. No acute cardiopulmonary disease.  US Abdomen Complete 07/13/2012  Solitary gallstone. No findings of cholecystitis. Minimal prominence of the pancreatic duct, nonspecific. Otherwise, normal appearing abdomen.  Ct Abdomen Pelvis W Contrast 07/12/2012  Cholelithiasis. Suggestion of small bowel wall thickening involving the jejunum and liquid stool in the colon. Changes likely to represent gastroenteritis or other inflammatory process. Mild fatty infiltration of the liver.  Antibiotics:  Vancomycin 03/22 -> Levaquin 03/22 -> Maxipime 03/22 -->  Code Status: Full  Family Communication: Pt and wife at bedside  Disposition Plan: Home  when medically stable vs SNF, to be determined   HPI/Subjective: No events overnight.   Objective: Filed Vitals:   07/15/12 2041 07/16/12 0406 07/16/12 0500 07/16/12 0600  BP: 170/78 168/79  136/60   Pulse: 88 90  88  Temp: 99.6 F (37.6 C)   98.9 F (37.2 C)  TempSrc: Oral   Oral  Resp: 20   20  Height:      Weight:   60.555 kg (133 lb 8 oz)   SpO2: 92% 96%  96%    Intake/Output Summary (Last 24 hours) at 07/16/12 0947 Last data filed at 07/16/12 0500  Gross per 24 hour  Intake    360 ml  Output   1275 ml  Net   -915 ml    Exam:   General:  Pt is somnolent but opens eyes when called by name, follows commands appropriately when awake, not in acute distress  Cardiovascular: Regular rate and rhythm, S1/S2, no murmurs, no rubs, no gallops  Respiratory: Clear to auscultation bilaterally, bibasilar rhonchi, no wheezing   Abdomen: Soft, non tender, non distended, bowel sounds present, no guarding  Extremities: No edema, pulses DP and PT palpable bilaterally  Neuro: Grossly nonfocal  Data Reviewed: Basic Metabolic Panel:  Recent Labs Lab 07/12/12 1910 07/13/12 0210 07/14/12 0415 07/15/12 0417 07/16/12 0434  NA 132* 132* 135 132* 130*  K 3.8 4.0 3.7 3.3* 3.4*  CL 97 99 103 99 95*  CO2 27 24 21 23 22   GLUCOSE 118* 104* 84 114* 108*  BUN 20 19 18 15 17   CREATININE 1.32 1.21 1.24 1.21 1.20  CALCIUM 8.6 8.4 8.6 8.7 9.0   Liver Function Tests:  Recent Labs Lab 07/12/12 1910 07/13/12 0210  AST 29 30  ALT 18 19  ALKPHOS 69 65  BILITOT 0.3 0.3  PROT 5.9* 5.8*  ALBUMIN 3.1* 3.1*    Recent Labs Lab 07/12/12 1910 07/13/12 0210 07/14/12 0415  LIPASE 91* 38 26   CBC:  Recent Labs Lab 07/12/12 1910 07/13/12 0210 07/14/12 0415 07/15/12 0417 07/16/12 0434  WBC 6.0 5.8 6.9 7.6 9.6  NEUTROABS 5.0  --   --   --   --   HGB 13.4 13.9 13.4 13.1 13.2  HCT 40.1 41.6 40.3 38.8* 39.0  MCV 99.0 99.0 98.8 95.6 94.0  PLT 204 182 172 193 199   Cardiac Enzymes:  Recent Labs Lab 07/12/12 1910 07/13/12 0209 07/13/12 0843 07/13/12 1345  TROPONINI <0.30 <0.30 <0.30 <0.30   Scheduled Meds: . amphetamine-dextroamphetamine  20 mg Oral BID AC  .  Armodafinil  250 mg Oral Daily  . aspirin  81 mg Oral Daily  . bisoprolol  5 mg Oral Daily  . buPROPion  150 mg Oral Daily  . ceFEPime (MAXIPIME) IV  1 g Intravenous Q8H  . donepezil  5 mg Oral QHS  . enoxaparin (LOVENOX) injection  40 mg Subcutaneous Q24H  . feeding supplement  1 Container Oral BID BM  . levofloxacin (LEVAQUIN) IV  750 mg Intravenous Q0600  . multivitamin with minerals  1 tablet Oral Daily  . pantoprazole  40 mg Oral Daily  . potassium chloride  40 mEq Oral Once  . sertraline  50 mg Oral Daily  . sodium chloride  3 mL Intravenous Q12H  . Sodium Oxybate  4,000 mg Oral Custom  . vancomycin  1,000 mg Intravenous QAC breakfast   Continuous Infusions:    Debbora Presto, MD  [facility] Pager [phone]  If 7PM-7AM, please contact  night-coverage [email or web address] Password [facility] 07/16/2012, 9:47 AM   LOS: 4 days

## 2012-07-16 NOTE — Progress Notes (Signed)
ANTIBIOTIC CONSULT NOTE - INITIAL  Pharmacy Consult for Vancomcyin Indication: pneumonia  Allergies  Allergen Reactions  . Metoprolol Other (See Comments)    Stomach cramps, gas, diarrhea    Patient Measurements: Height: 5\' 7"  (170.2 cm) Weight: 133 lb 8 oz (60.555 kg) IBW/kg (Calculated) : 66.1 Adjusted Body Weight:   Vital Signs: Temp: 98.9 F (37.2 C) (03/22 0600) Temp src: Oral (03/22 0600) BP: 136/60 mmHg (03/22 0600) Pulse Rate: 88 (03/22 0600) Intake/Output from previous day: 03/21 0701 - 03/22 0700 In: 360 [P.O.:360] Out: 1275 [Urine:1275] Intake/Output from this shift:    Labs:  Recent Labs  07/14/12 0415 07/15/12 0417 07/16/12 0434  WBC 6.9 7.6 9.6  HGB 13.4 13.1 13.2  PLT 172 193 199  CREATININE 1.24 1.21 1.20   Estimated Creatinine Clearance: 41.4 ml/min (by C-G formula based on Cr of 1.2). No results found for this basename: VANCOTROUGH, VANCOPEAK, VANCORANDOM, GENTTROUGH, GENTPEAK, GENTRANDOM, TOBRATROUGH, TOBRAPEAK, TOBRARND, AMIKACINPEAK, AMIKACINTROU, AMIKACIN,  in the last 72 hours   Microbiology: No results found for this or any previous visit (from the past 720 hour(s)).  Medical History: Past Medical History  Diagnosis Date  . Coronary artery disease   . Hypertension   . Renal disorder   . High cholesterol   . Narcolepsy   . Myocardial infarction     1994  . Barrett's esophagus     Medications:  Anti-infectives   Start     Dose/Rate Route Frequency Ordered Stop   07/16/12 0700  levofloxacin (LEVAQUIN) IVPB 750 mg     750 mg 100 mL/hr over 90 Minutes Intravenous Daily 07/16/12 0644 07/19/12 0559   07/16/12 0700  vancomycin (VANCOCIN) IVPB 1000 mg/200 mL premix     1,000 mg 200 mL/hr over 60 Minutes Intravenous Daily before breakfast 07/16/12 0657     07/16/12 0645  ceFEPIme (MAXIPIME) 1 g in dextrose 5 % 50 mL IVPB     1 g 100 mL/hr over 30 Minutes Intravenous 3 times per day 07/16/12 0644 07/24/12 0559      Assessment: Patient with PNA.  Vancomycin for PNA.  Goal of Therapy:  Vancomycin trough level 15-20 mcg/ml  Plan:  Measure antibiotic drug levels at steady state Follow up culture results Vancomycin 1gm iv q24hr  Aleene Davidson Crowford 07/16/2012,7:08 AM

## 2012-07-16 NOTE — Progress Notes (Signed)
  Pharmacy Note (Brief) - Renal Abx Adjustment  79 yo M started on Vanco, cefepime, and Levaquin for PNA. See note from previous pharmacist for full details. SCr 1.2, wt 60.6kg, CrCl ~ 11ml/min. Will renally adjust Levaquin and cefepime for CrCl<50.  1) Change Levaquin to 750mg  IV q48h x 3 days total (1 more dose) 2) Change cefepime to q 12h x 8 days total.  Darrol Angel, PharmD Pager: [phone] 07/16/2012 12:00 PM

## 2012-07-16 NOTE — Evaluation (Signed)
Occupational Therapy Evaluation Patient Details Name: Kirk Townsend MRN: [id number] DOB: [birth year] Today's Date: 07/16/2012 Time: 5284-1324 OT Time Calculation (min): 24 min  OT Assessment / Plan / Recommendation Clinical Impression  Pt admitted for nausea and vomiting and now c/o generalized weakness.  Upon standing, pt reported he couldn't tolerate ambulation today.  Pt would benefit from acute OT services in order to improve independence with transfers and ADL activity to prepare for d/c to next venue.  Spouse reports she could not manage pt at current level however feels he may progress to home.    OT Assessment  Patient needs continued OT Services    Follow Up Recommendations  Home health OT;SNF (depending on progress)       Equipment Recommendations  Other (comment);3 in 1 bedside comode       Frequency  Min 2X/week    Precautions / Restrictions Precautions Precautions: Fall       ADL  Grooming: Simulated Where Assessed - Grooming: Unsupported sitting Upper Body Bathing: Simulated;Minimal assistance Where Assessed - Upper Body Bathing: Unsupported sitting Lower Body Bathing: Simulated;Maximal assistance Where Assessed - Lower Body Bathing: Supported sit to stand Upper Body Dressing: Simulated;Minimal assistance Where Assessed - Upper Body Dressing: Unsupported sitting Lower Body Dressing: Simulated;Maximal assistance Where Assessed - Lower Body Dressing: Supported sit to Art therapist Transfer: Performed;Moderate assistance Toilet Transfer Method: Sit to stand;Stand pivot;Other (comment) (bed to chair) Toileting - Clothing Manipulation and Hygiene: Performed;Moderate assistance Where Assessed - Toileting Clothing Manipulation and Hygiene: Standing Transfers/Ambulation Related to ADLs: Pt with posterior lean during OT sesion. Verbal cues needed to find midline and balance.    OT Diagnosis: Generalized weakness  OT Problem List: Decreased strength;Decreased activity  tolerance  OT Goals Acute Rehab OT Goals Time For Goal Achievement: 07/16/12 ADL Goals Pt Will Perform Grooming: with supervision;Standing at sink ADL Goal: Grooming - Progress: Goal set today Pt Will Perform Upper Body Dressing: with supervision;Unsupported;Sitting, chair ADL Goal: Upper Body Dressing - Progress: Goal set today Pt Will Perform Lower Body Dressing: with supervision;Sit to stand from chair ADL Goal: Lower Body Dressing - Progress: Goal set today Pt Will Transfer to Toilet: with supervision;Comfort height toilet ADL Goal: Toilet Transfer - Progress: Goal set today Pt Will Perform Toileting - Clothing Manipulation: with supervision;Standing ADL Goal: Toileting - Clothing Manipulation - Progress: Goal set today Pt Will Perform Toileting - Hygiene: with supervision;Sit to stand from 3-in-1/toilet ADL Goal: Toileting - Hygiene - Progress: Goal set today  Visit Information  Last OT Received On: 07/16/12       Prior Functioning     Home Living Lives With: Spouse Type of Home: House Bathroom Shower/Tub: Tub/shower unit Prior Function Level of Independence: Independent Able to Take Stairs?: Yes Vocation: Retired Musician: No difficulties         Vision/Perception Vision - History Patient Visual Report: No change from baseline Vision - Assessment Eye Alignment: Within Functional Limits   Cognition  Cognition Overall Cognitive Status: Appears within functional limits for tasks assessed/performed Arousal/Alertness: Awake/alert Orientation Level: Appears intact for tasks assessed Behavior During Session: [facility] for tasks performed    Extremity/Trunk Assessment Right Upper Extremity Assessment RUE ROM/Strength/Tone: Within functional levels Left Upper Extremity Assessment LUE ROM/Strength/Tone: Within functional levels     Mobility Bed Mobility Bed Mobility: Supine to Sit Supine to Sit: 3: Mod assist Transfers Transfers: Sit to  Stand;Stand to Sit Sit to Stand: 3: Mod assist;From bed Stand to Sit: 3: Mod assist;With armrests;To chair/3-in-1  End of Session OT - End of Session Activity Tolerance: Patient limited by fatigue Patient left: in chair;with call bell/phone within reach Nurse Communication: Mobility status  GO     Alba Cory 07/16/2012, 1:37 PM

## 2012-07-17 LAB — CBC
HCT: 33.7 % — ABNORMAL LOW (ref 39.0–52.0)
Hemoglobin: 12 g/dL — ABNORMAL LOW (ref 13.0–17.0)
MCH: 33.3 pg (ref 26.0–34.0)
MCHC: 35.6 g/dL (ref 30.0–36.0)
RBC: 3.6 MIL/uL — ABNORMAL LOW (ref 4.22–5.81)

## 2012-07-17 LAB — BASIC METABOLIC PANEL
BUN: 27 mg/dL — ABNORMAL HIGH (ref 6–23)
CO2: 23 mEq/L (ref 19–32)
Calcium: 8.6 mg/dL (ref 8.4–10.5)
GFR calc non Af Amer: 45 mL/min — ABNORMAL LOW (ref >90)
Glucose, Bld: 96 mg/dL (ref 70–99)
Potassium: 3 mEq/L — ABNORMAL LOW (ref 3.5–5.1)
Sodium: 132 mEq/L — ABNORMAL LOW (ref 135–145)

## 2012-07-17 MED ORDER — POTASSIUM CHLORIDE CRYS ER 20 MEQ PO TBCR
40.0000 meq | EXTENDED_RELEASE_TABLET | Freq: Two times a day (BID) | ORAL | Status: AC
  Administered 2012-07-17 (×2): 40 meq via ORAL
  Filled 2012-07-17 (×2): qty 2

## 2012-07-17 NOTE — Progress Notes (Signed)
Bladder scan done this am showed 642 ml. Afterwards, pt voided 225 on BSC. MD notified, foley catheter placed. Will continue to monitor.

## 2012-07-17 NOTE — Progress Notes (Signed)
Clinical Social Work Department BRIEF PSYCHOSOCIAL ASSESSMENT 07/17/2012  Patient:  Kirk Townsend, Kirk Townsend     Account Number:  [id number]     Admit date:  07/12/2012  Clinical Social Worker:  Doroteo Glassman  Date/Time:  07/17/2012 01:11 PM  Referred by:  Physician  Date Referred:  07/17/2012 Referred for  SNF Placement   Other Referral:   Interview type:  Patient Other interview type:   Pt's wife and family friend at bedside    PSYCHOSOCIAL DATA Living Status:  WIFE Admitted from facility:   Level of care:   Primary support name:  Mrs. Waldo Laine Primary support relationship to patient:  SPOUSE Degree of support available:   strong    CURRENT CONCERNS Current Concerns  Post-Acute Placement   Other Concerns:    SOCIAL WORK ASSESSMENT / PLAN Met with Pt and wife to discuss d/c plans.    Pt's wife was agreeable to SNF search and only wants something close to [facility], as she doesn't see well and is unable to drive.  She will have to rely upon family and friends to transport her to the SNF.    CSW provided Mrs. Waldo Laine with SNF list and starred the facility's that are in close proximity to her.    CSW thanked Pt and his wife for their time.   Assessment/plan status:  Psychosocial Support/Ongoing Assessment of Needs Other assessment/ plan:   Information/referral to community resources:   [place] [facility]    PATIENT'S/FAMILY'S RESPONSE TO PLAN OF CARE: Pt and wife thanked CSW for time and assistance.   Providence Crosby, LCSWA Clinical Social Work [phone]

## 2012-07-17 NOTE — Progress Notes (Signed)
Patient ID: AISEA BOULDIN, male   DOB: Jan 04, 1929, 79 y.o.   MRN: [id number]  [facility] PROGRESS NOTE  KONG PACKETT WUJ:[id number] DOB: [birth year] DOA: 07/12/2012 PCP: Londell Moh, MD  Brief narrative:  Pt is a 79 y.o. male with history of CAD, status post angioplasty, narcolepsy, hypertension, CHF, chronic kidney disease, who was brought to the ER because of nausea and vomiting. Patient has been having hiccups for last 2 weeks, associated with poor oral intake and generalized malaise. On 03/21, pt has developed acute respiratory failure with hypoxia and CXR has demonstrated development on right upper lobe PNA with development of CHF. Pt started on broad spectrum ABX to cover for presumptive HCAP. One dose of Lasix IV given 07/15/2012.   In the ER patient had CT abdomen and pelvis which showed features for possible gastroenteritis, cholelithiasis. Lipase elevated at 91 on admission.   Principal Problem:  Acute hypoxic respiratory failure  - this is likely multifactorial and secondary to development of PNA and CHF  - continue broad spectrum ABX to cover for presumptive HCAP and plan on transitioning to PO in AM - one dose of Lasix given 03/21, will hold of on Lasix for now due to overall euvolemic state and hyponatremia acute renal failure   - will monitor I's and O's and daily weights  Nausea & vomiting  - possibly related to acute pancreatitis given elevated lipase on admission 91  - pt reports feeling better and lipase is within normal limits  - on Abdominal US cholelithiasis noted  - will continue supportive care for now as recommended by GI specialist, IVF, analgesia and antiemetics as needed  - continue regular diet  Active Problems:  Slurred speech 03/20  - MRI of the brain negative for acute stroke, however old progressive chronic ischemic changes noted  - pt is at baseline mental status and overall non focal physical exam  Chronic CHF, combined systolic and  diastolic  - last EF 45% (08/2011)  - repeat 2 D ECHO indicating EF 50% (07/13/2012)  - monitor daily weights, I's and O's  CKD (chronic kidney disease)  - based on GFR stage II - III  - creatinine is up from yesterday, close monitor of renal function  - BMP in AM  Hiccoughs  - thorazine as needed helps with symptom control, continue same regimen  Hypokalemia  - Mild, will supplement as indicated, BMP in the morning   Consultants:  GI consultation over the phone  Procedures/Studies:  Mr Brain Wo Contrast 07/14/2012  No acute infarct. No acute intracranial abnormality identified. Cerebral volume loss and mild to moderate nonspecific cerebral white matter signal changes.The examination had to be discontinued prior to completion.  Dg Chest Port 1 View 07/15/2012  Asymmetric airspace consolidation in the right upper lobe concerning for pneumonia. Cardiomegaly with cephalization of the pulmonary vasculature and diffuse interstitial prominence suggestive of background of mild interstitial pulmonary edema, possibly related to congestive heart failure.  Dg Chest 2 View 07/12/2012  Stable COPD/emphysema and stable mild cardiomegaly. No acute cardiopulmonary disease.  US Abdomen Complete 07/13/2012  Solitary gallstone. No findings of cholecystitis. Minimal prominence of the pancreatic duct, nonspecific. Otherwise, normal appearing abdomen.  Ct Abdomen Pelvis W Contrast 07/12/2012  Cholelithiasis. Suggestion of small bowel wall thickening involving the jejunum and liquid stool in the colon. Changes likely to represent gastroenteritis or other inflammatory process. Mild fatty infiltration of the liver.  Antibiotics:  Vancomycin 03/22 ->  Levaquin 03/22 ->  Maxipime 03/22 -->  Code Status: Full  Family Communication: Pt and wife at bedside  Disposition Plan: Home when medically stable vs SNF, to be determined   HPI/Subjective: No events overnight.   Objective: Filed Vitals:   07/16/12 0600  07/16/12 1300 07/16/12 2150 07/17/12 0548  BP: 136/60 138/85 114/50 140/70  Pulse: 88 85 80 80  Temp: 98.9 F (37.2 C) 98.7 F (37.1 C) 98.2 F (36.8 C) 98.6 F (37 C)  TempSrc: Oral Oral Oral Oral  Resp: 20 19 20 18   Height:      Weight:    60 kg (132 lb 4.4 oz)  SpO2: 96% 97% 97% 97%    Intake/Output Summary (Last 24 hours) at 07/17/12 1610 Last data filed at 07/16/12 2156  Gross per 24 hour  Intake     75 ml  Output    600 ml  Net   -525 ml   Exam:   General:  Pt is alert, follows commands appropriately, not in acute distress  Cardiovascular: Regular rate and rhythm, S1/S2, no murmurs, no rubs, no gallops  Respiratory: Clear to auscultation bilaterally, no wheezing, crackles at bases  Abdomen: Soft, non tender, non distended, bowel sounds present, no guarding  Extremities: No edema, pulses DP and PT palpable bilaterally  Neuro: Grossly nonfocal  Data Reviewed: Basic Metabolic Panel:  Recent Labs Lab 07/13/12 0210 07/14/12 0415 07/15/12 0417 07/16/12 0434 07/17/12 0446  NA 132* 135 132* 130* 132*  K 4.0 3.7 3.3* 3.4* 3.0*  CL 99 103 99 95* 97  CO2 24 21 23 22 23   GLUCOSE 104* 84 114* 108* 96  BUN 19 18 15 17  27*  CREATININE 1.21 1.24 1.21 1.20 1.41*  CALCIUM 8.4 8.6 8.7 9.0 8.6   Liver Function Tests:  Recent Labs Lab 07/12/12 1910 07/13/12 0210  AST 29 30  ALT 18 19  ALKPHOS 69 65  BILITOT 0.3 0.3  PROT 5.9* 5.8*  ALBUMIN 3.1* 3.1*    Recent Labs Lab 07/12/12 1910 07/13/12 0210 07/14/12 0415  LIPASE 91* 38 26   CBC:  Recent Labs Lab 07/12/12 1910 07/13/12 0210 07/14/12 0415 07/15/12 0417 07/16/12 0434 07/17/12 0446  WBC 6.0 5.8 6.9 7.6 9.6 8.7  NEUTROABS 5.0  --   --   --   --   --   HGB 13.4 13.9 13.4 13.1 13.2 12.0*  HCT 40.1 41.6 40.3 38.8* 39.0 33.7*  MCV 99.0 99.0 98.8 95.6 94.0 93.6  PLT 204 182 172 193 199 197   Cardiac Enzymes:  Recent Labs Lab 07/12/12 1910 07/13/12 0209 07/13/12 0843 07/13/12 1345   TROPONINI <0.30 <0.30 <0.30 <0.30   Recent Results (from the past 240 hour(s))  CULTURE, BLOOD (ROUTINE X 2)     Status: None   Collection Time    07/16/12  7:40 AM      Result Value Range Status   Specimen Description BLOOD LEFT ARM   Final   Special Requests BOTTLES DRAWN AEROBIC AND ANAEROBIC 5CC   Final   Culture  Setup Time 07/16/2012 14:49   Final   Culture     Final   Value:        BLOOD CULTURE RECEIVED NO GROWTH TO DATE CULTURE WILL BE HELD FOR 5 DAYS BEFORE ISSUING A FINAL NEGATIVE REPORT   Report Status PENDING   Incomplete  CULTURE, BLOOD (ROUTINE X 2)     Status: None   Collection Time    07/16/12  7:45 AM      Result  Value Range Status   Specimen Description BLOOD RIGHT ARM   Final   Special Requests BOTTLES DRAWN AEROBIC AND ANAEROBIC 5CC   Final   Culture  Setup Time 07/16/2012 14:49   Final   Culture     Final   Value:        BLOOD CULTURE RECEIVED NO GROWTH TO DATE CULTURE WILL BE HELD FOR 5 DAYS BEFORE ISSUING A FINAL NEGATIVE REPORT   Report Status PENDING   Incomplete     Scheduled Meds: . amphetamine-dextroamphetamine  20 mg Oral BID AC  . Armodafinil  250 mg Oral Daily  . aspirin  81 mg Oral Daily  . bisoprolol  5 mg Oral Daily  . buPROPion  150 mg Oral Daily  . ceFEPime (MAXIPIME) IV  1 g Intravenous Q12H  . donepezil  5 mg Oral QHS  . enoxaparin (LOVENOX) injection  40 mg Subcutaneous Q24H  . feeding supplement  1 Container Oral BID BM  . [START ON 07/18/2012] levofloxacin (LEVAQUIN) IV  750 mg Intravenous Q48H  . multivitamin with minerals  1 tablet Oral Daily  . pantoprazole  40 mg Oral Daily  . potassium chloride  40 mEq Oral Once  . sertraline  50 mg Oral Daily  . sodium chloride  3 mL Intravenous Q12H  . Sodium Oxybate  4,000 mg Oral Custom  . vancomycin  1,000 mg Intravenous QAC breakfast   Continuous Infusions:    Debbora Presto, MD  [facility] Pager [phone]  If 7PM-7AM, please contact night-coverage [email or web address] Password  [facility] 07/17/2012, 9:28 AM   LOS: 5 days

## 2012-07-17 NOTE — Progress Notes (Signed)
Clinical Social Work Department CLINICAL SOCIAL WORK PLACEMENT NOTE 07/17/2012  Patient:  Kirk Townsend, Kirk Townsend  Account Number:  [id number] Admit date:  07/12/2012  Clinical Social Worker:  Doroteo Glassman  Date/time:  07/17/2012 01:14 PM  Clinical Social Work is seeking post-discharge placement for this patient at the following level of care:   SKILLED NURSING   (*CSW will update this form in Epic as items are completed)   07/17/2012  Patient/family provided with [facility] Department of Clinical Social Work's list of facilities offering this level of care within the geographic area requested by the patient (or if unable, by the patient's family).  07/17/2012  Patient/family informed of their freedom to choose among providers that offer the needed level of care, that participate in Medicare, Medicaid or managed care program needed by the patient, have an available bed and are willing to accept the patient.  07/17/2012  Patient/family informed of [facility]' ownership interest in [facility], as well as of the fact that they are under no obligation to receive care at this facility.  PASARR submitted to EDS on  PASARR number received from EDS on   FL2 transmitted to all facilities in geographic area requested by pt/family on  07/17/2012 FL2 transmitted to all facilities within larger geographic area on   Patient informed that his/her managed care company has contracts with or will negotiate with  certain facilities, including the following:     Patient/family informed of bed offers received:   Patient chooses bed at  Physician recommends and patient chooses bed at    Patient to be transferred to  on   Patient to be transferred to facility by   The following physician request were entered in Epic:   Additional Comments:  Providence Crosby, Theresia Majors Clinical Social Work [phone]

## 2012-07-18 LAB — CBC
Hemoglobin: 12.2 g/dL — ABNORMAL LOW (ref 13.0–17.0)
MCH: 33.2 pg (ref 26.0–34.0)
MCHC: 35.4 g/dL (ref 30.0–36.0)
MCV: 93.8 fL (ref 78.0–100.0)
RBC: 3.68 MIL/uL — ABNORMAL LOW (ref 4.22–5.81)

## 2012-07-18 LAB — BASIC METABOLIC PANEL
CO2: 22 mEq/L (ref 19–32)
Creatinine, Ser: 1.3 mg/dL (ref 0.50–1.35)
GFR calc Af Amer: 58 mL/min — ABNORMAL LOW (ref >90)
Glucose, Bld: 107 mg/dL — ABNORMAL HIGH (ref 70–99)

## 2012-07-18 LAB — LEGIONELLA ANTIGEN, URINE

## 2012-07-18 MED ORDER — PROMETHAZINE HCL 25 MG PO TABS
25.0000 mg | ORAL_TABLET | Freq: Four times a day (QID) | ORAL | Status: DC | PRN
  Administered 2012-07-19 – 2012-07-20 (×2): 25 mg via ORAL
  Filled 2012-07-18 (×2): qty 1

## 2012-07-18 MED ORDER — VITAMINS A & D EX OINT
TOPICAL_OINTMENT | CUTANEOUS | Status: AC
  Administered 2012-07-18: 5
  Filled 2012-07-18: qty 5

## 2012-07-18 MED ORDER — LEVOFLOXACIN 500 MG PO TABS
500.0000 mg | ORAL_TABLET | Freq: Every day | ORAL | Status: DC
  Administered 2012-07-19 – 2012-07-23 (×5): 500 mg via ORAL
  Filled 2012-07-18 (×5): qty 1

## 2012-07-18 MED ORDER — CHLORPROMAZINE HCL 50 MG PO TABS
50.0000 mg | ORAL_TABLET | Freq: Three times a day (TID) | ORAL | Status: DC | PRN
  Administered 2012-07-18: 50 mg via ORAL
  Filled 2012-07-18: qty 1

## 2012-07-18 NOTE — Progress Notes (Signed)
Pt had scheduled Sodium Oxybate medicine (for Narcolepsy- pt supplied home med) at 0400am.  When I went to wake the pt up to give him his medicine, I looked in the pt's cabinet (where they keep this medicine) and found that one vial of the medicine was missing. I had helped the pt's wife earlier in the evening prepare this med for the next two nights (since the pt may be staying here 2 more nights).  So when I came back around 0400-0430 I noticed that one of the vials of medicine was missing.  I searched the pt's room and could not find this one vial.  Both the pt and pt's wife were aware that one vial (one dose) was missing.  They both were surprised and did not know what had happened to it.  I will pass this info onto the dayshift RN and charge RN.  Will continue to monitor pt and try to follow up on missing vial of med.

## 2012-07-18 NOTE — Progress Notes (Addendum)
Patient ID: Kirk Townsend, male   DOB: 02/13/1929, 79 y.o.   MRN: [id number]  [facility] PROGRESS NOTE  Kirk Townsend OZD:[id number] DOB: 04/26/1929 DOA: 07/12/2012 PCP: Londell Moh, MD  Brief narrative:  Pt is a 79 y.o. male with history of CAD, status post angioplasty, narcolepsy, hypertension, CHF, chronic kidney disease, who was brought to the ER because of nausea and vomiting. Patient has been having hiccups for last 2 weeks, associated with poor oral intake and generalized malaise. On 03/21, pt has developed acute respiratory failure with hypoxia and CXR has demonstrated development on right upper lobe PNA with development of CHF. Pt started on broad spectrum ABX to cover for presumptive HCAP. One dose of Lasix IV given 07/15/2012.   In the ER patient had CT abdomen and pelvis which showed features for possible gastroenteritis, cholelithiasis. Lipase elevated at 91 on admission.   Principal Problem:  Acute hypoxic respiratory failure  - this is likely multifactorial and secondary to development of PNA and CHF, pt clinically improving and not in acute distress   - change ABX to Levaquin today  - one dose of Lasix given 03/21, will hold of on Lasix for now due to overall euvolemic state and hyponatremia acute renal failure  - will monitor I's and O's and daily weights  Nausea & vomiting  - possibly related to acute pancreatitis - pt reports feeling better and lipase is within normal limits, pt tolerating regular diet well, no vomiting   - on Abdominal US cholelithiasis noted  - will continue supportive care for now as recommended by GI specialist, IVF, analgesia and antiemetics as needed  - continue regular diet  Active Problems:  Bladder outlet obstruction - PRUV > 600 after bladder scan, placed foley 03/24 and will continue to keep in place  Slurred speech 03/20  - MRI of the brain negative for acute stroke, however old progressive chronic ischemic changes noted  - pt  is at baseline mental status and overall non focal physical exam  Chronic CHF, combined systolic and diastolic  - last EF 45% (08/2011)  - repeat 2 D ECHO indicating EF 50% (07/13/2012)  - monitor daily weights, I's and O's  CKD (chronic kidney disease)  - based on GFR stage II - III  - creatinine is now within normal limits  - BMP in AM  Hiccoughs  - thorazine as needed helps with symptom control, continue same regimen  Hypokalemia  - supplemented and within normal limits this AM  Consultants:  GI consultation over the phone  Procedures/Studies:  Mr Brain Wo Contrast 07/14/2012  No acute infarct. No acute intracranial abnormality identified. Cerebral volume loss and mild to moderate nonspecific cerebral white matter signal changes.The examination had to be discontinued prior to completion.  Dg Chest Port 1 View 07/15/2012  Asymmetric airspace consolidation in the right upper lobe concerning for pneumonia. Cardiomegaly with cephalization of the pulmonary vasculature and diffuse interstitial prominence suggestive of background of mild interstitial pulmonary edema, possibly related to congestive heart failure.  Dg Chest 2 View 07/12/2012  Stable COPD/emphysema and stable mild cardiomegaly. No acute cardiopulmonary disease.  US Abdomen Complete 07/13/2012  Solitary gallstone. No findings of cholecystitis. Minimal prominence of the pancreatic duct, nonspecific. Otherwise, normal appearing abdomen.  Ct Abdomen Pelvis W Contrast 07/12/2012  Cholelithiasis. Suggestion of small bowel wall thickening involving the jejunum and liquid stool in the colon. Changes likely to represent gastroenteritis or other inflammatory process. Mild fatty infiltration of the liver.  Antibiotics:  Vancomycin 03/22 -> 03/24 Levaquin 03/22 -> changed to PO 03/24 Maxipime 03/22 --> 03/24  Code Status: Full  Family Communication: Pt, daughter, and wife at bedside  Disposition Plan: SNF likely in AM  HPI/Subjective: No  events overnight.   Objective: Filed Vitals:   07/17/12 0548 07/17/12 1430 07/17/12 2100 07/18/12 0623  BP: 140/70 117/68 134/70 158/67  Pulse: 80 74 76 98  Temp: 98.6 F (37 C) 97.6 F (36.4 C) 98 F (36.7 C) 99.2 F (37.3 C)  TempSrc: Oral Oral Oral Oral  Resp: 18 18 16 18   Height:      Weight: 65 kg (143 lb 4.8 oz)   65.4 kg (144 lb 2.9 oz)  SpO2: 97% 100% 99% 97%    Intake/Output Summary (Last 24 hours) at 07/18/12 1628 Last data filed at 07/18/12 1110  Gross per 24 hour  Intake    670 ml  Output    626 ml  Net     44 ml    Exam:   General:  Pt is alert, follows commands appropriately, not in acute distress, frail and chronically ill appearing   Cardiovascular: Regular rate and rhythm, S1/S2, no murmurs, no rubs, no gallops  Respiratory: Clear to auscultation bilaterally, no wheezing, no crackles, no rhonchi, decreased breath sounds at bases  Abdomen: Soft, non tender, non distended, bowel sounds present, no guarding  Extremities: No edema, pulses DP and PT palpable bilaterally  Neuro: Grossly nonfocal  Data Reviewed: Basic Metabolic Panel:  Recent Labs Lab 07/14/12 0415 07/15/12 0417 07/16/12 0434 07/17/12 0446 07/18/12 0410  NA 135 132* 130* 132* 133*  K 3.7 3.3* 3.4* 3.0* 3.6  CL 103 99 95* 97 101  CO2 21 23 22 23 22   GLUCOSE 84 114* 108* 96 107*  BUN 18 15 17  27* 28*  CREATININE 1.24 1.21 1.20 1.41* 1.30  CALCIUM 8.6 8.7 9.0 8.6 8.8   Liver Function Tests:  Recent Labs Lab 07/12/12 1910 07/13/12 0210  AST 29 30  ALT 18 19  ALKPHOS 69 65  BILITOT 0.3 0.3  PROT 5.9* 5.8*  ALBUMIN 3.1* 3.1*    Recent Labs Lab 07/12/12 1910 07/13/12 0210 07/14/12 0415  LIPASE 91* 38 26   CBC:  Recent Labs Lab 07/12/12 1910  07/14/12 0415 07/15/12 0417 07/16/12 0434 07/17/12 0446 07/18/12 0410  WBC 6.0  < > 6.9 7.6 9.6 8.7 8.5  NEUTROABS 5.0  --   --   --   --   --   --   HGB 13.4  < > 13.4 13.1 13.2 12.0* 12.2*  HCT 40.1  < > 40.3  38.8* 39.0 33.7* 34.5*  MCV 99.0  < > 98.8 95.6 94.0 93.6 93.8  PLT 204  < > 172 193 199 197 271  < > = values in this interval not displayed. Cardiac Enzymes:  Recent Labs Lab 07/12/12 1910 07/13/12 0209 07/13/12 0843 07/13/12 1345  TROPONINI <0.30 <0.30 <0.30 <0.30    Recent Results (from the past 240 hour(s))  CULTURE, BLOOD (ROUTINE X 2)     Status: None   Collection Time    07/16/12  7:40 AM      Result Value Range Status   Specimen Description BLOOD LEFT ARM   Final   Special Requests BOTTLES DRAWN AEROBIC AND ANAEROBIC 5CC   Final   Culture  Setup Time 07/16/2012 14:49   Final   Culture     Final   Value:  BLOOD CULTURE RECEIVED NO GROWTH TO DATE CULTURE WILL BE HELD FOR 5 DAYS BEFORE ISSUING A FINAL NEGATIVE REPORT   Report Status PENDING   Incomplete  CULTURE, BLOOD (ROUTINE X 2)     Status: None   Collection Time    07/16/12  7:45 AM      Result Value Range Status   Specimen Description BLOOD RIGHT ARM   Final   Special Requests BOTTLES DRAWN AEROBIC AND ANAEROBIC 5CC   Final   Culture  Setup Time 07/16/2012 14:49   Final   Culture     Final   Value:        BLOOD CULTURE RECEIVED NO GROWTH TO DATE CULTURE WILL BE HELD FOR 5 DAYS BEFORE ISSUING A FINAL NEGATIVE REPORT   Report Status PENDING   Incomplete     Scheduled Meds: . amphetamine-dextroamph  20 mg Oral BID AC  . Armodafinil  250 mg Oral Daily  . aspirin  81 mg Oral Daily  . bisoprolol  5 mg Oral Daily  . buPROPion  150 mg Oral Daily  . donepezil  5 mg Oral QHS  . enoxaparin injection  40 mg Subcutaneous Q24H  . feeding supplement  1 Container Oral BID BM  .  levofloxacin  500 mg Oral Daily  . pantoprazole  40 mg Oral Daily  . sertraline  50 mg Oral Daily  . sodium chloride  3 mL Intravenous Q12H  . Sodium Oxybate  4,000 mg Oral Custom   Continuous Infusions:    Debbora Presto, MD  [facility] Pager [phone]  If 7PM-7AM, please contact night-coverage [email or web address] Password  [facility] 07/18/2012, 4:28 PM   LOS: 6 days

## 2012-07-18 NOTE — Progress Notes (Signed)
Occupational Therapy Treatment Patient Details Name: DENY CHEVEZ MRN: [id number] DOB: [birth year] Today's Date: 07/18/2012 Time: 4098-1191 OT Time Calculation (min): 42 min  OT Assessment / Plan / Recommendation    Follow Up Recommendations  SNF       Equipment Recommendations  None recommended by OT       Frequency Min 2X/week   Plan Discharge plan needs to be updated    Precautions / Restrictions Precautions Precautions: Fall       ADL  Toileting - Clothing Manipulation and Hygiene: Performed;Other (comment);Minimal assistance (bed to chair with walker) Where Assessed - Toileting Clothing Manipulation and Hygiene: Standing Transfers/Ambulation Related to ADLs: Pt with posterior lean during OT sesion. Verbal cues needed to find midline and balance. ADL Comments: Upon standing, pt did have posterior lean- but with verbal cues pt able to find midline. OT did have pt use walker which was beneficial      OT Goals ADL Goals ADL Goal: Toilet Transfer - Progress: Progressing toward goals  Visit Information  Last OT Received On: 07/18/12    Subjective Data  Subjective: Wife and daugther present for session. Pt very sleep , but did wake up with cueing from OT      Cognition  Cognition Overall Cognitive Status: Appears within functional limits for tasks assessed/performed Arousal/Alertness: Lethargic Behavior During Session: Lethargic Cognition - Other Comments: Wife stated pt had a bad night and that wasa reason he was so sleepy.      Mobility  Bed Mobility Bed Mobility: Supine to Sit Supine to Sit: 3: Mod assist;With rails;HOB elevated Transfers Transfers: Sit to Stand;Stand to Sit Sit to Stand: 4: Min assist;From bed;With upper extremity assist Stand to Sit: 4: Min assist;To chair/3-in-1;With upper extremity assist Details for Transfer Assistance: verbal cues needed for hand placement          End of Session OT - End of Session Activity Tolerance: Patient  limited by fatigue Pt left in chair with call bell and family present.  GO     Alba Cory 07/18/2012, 1:47 PM

## 2012-07-19 DIAGNOSIS — K8020 Calculus of gallbladder without cholecystitis without obstruction: Secondary | ICD-10-CM

## 2012-07-19 LAB — HEPATIC FUNCTION PANEL
ALT: 48 U/L (ref 0–53)
AST: 62 U/L — ABNORMAL HIGH (ref 0–37)
Albumin: 2.3 g/dL — ABNORMAL LOW (ref 3.5–5.2)
Alkaline Phosphatase: 97 U/L (ref 39–117)
Bilirubin, Direct: 0.1 mg/dL (ref 0.0–0.3)
Indirect Bilirubin: 0.3 mg/dL (ref 0.3–0.9)
Total Bilirubin: 0.4 mg/dL (ref 0.3–1.2)
Total Protein: 5.6 g/dL — ABNORMAL LOW (ref 6.0–8.3)

## 2012-07-19 LAB — LIPASE, BLOOD: Lipase: 67 U/L — ABNORMAL HIGH (ref 11–59)

## 2012-07-19 LAB — CREATININE, SERUM
Creatinine, Ser: 1.21 mg/dL (ref 0.50–1.35)
GFR calc Af Amer: 63 mL/min — ABNORMAL LOW (ref >90)
GFR calc non Af Amer: 54 mL/min — ABNORMAL LOW (ref >90)

## 2012-07-19 LAB — CBC
HCT: 34.7 % — ABNORMAL LOW (ref 39.0–52.0)
Hemoglobin: 12.1 g/dL — ABNORMAL LOW (ref 13.0–17.0)
MCH: 33.2 pg (ref 26.0–34.0)
MCHC: 34.9 g/dL (ref 30.0–36.0)
MCV: 95.3 fL (ref 78.0–100.0)
Platelets: 309 K/uL (ref 150–400)
RBC: 3.64 MIL/uL — ABNORMAL LOW (ref 4.22–5.81)
RDW: 13.4 % (ref 11.5–15.5)
WBC: 9.6 K/uL (ref 4.0–10.5)

## 2012-07-19 MED ORDER — CHLORPROMAZINE HCL 50 MG PO TABS
50.0000 mg | ORAL_TABLET | Freq: Four times a day (QID) | ORAL | Status: DC | PRN
  Administered 2012-07-19 – 2012-07-24 (×11): 50 mg via ORAL
  Filled 2012-07-19 (×8): qty 1

## 2012-07-19 MED ORDER — BOOST / RESOURCE BREEZE PO LIQD
1.0000 | ORAL | Status: DC
  Administered 2012-07-21 – 2012-07-25 (×4): 1 via ORAL

## 2012-07-19 MED ORDER — PANTOPRAZOLE SODIUM 40 MG PO TBEC
40.0000 mg | DELAYED_RELEASE_TABLET | Freq: Two times a day (BID) | ORAL | Status: DC
  Administered 2012-07-19 – 2012-07-25 (×12): 40 mg via ORAL
  Filled 2012-07-19 (×16): qty 1

## 2012-07-19 MED ORDER — CYCLOBENZAPRINE HCL 5 MG PO TABS
5.0000 mg | ORAL_TABLET | Freq: Once | ORAL | Status: AC
  Administered 2012-07-19: 5 mg via ORAL
  Filled 2012-07-19: qty 1

## 2012-07-19 MED ORDER — CHLORPROMAZINE HCL 25 MG/ML IJ SOLN
50.0000 mg | Freq: Four times a day (QID) | INTRAMUSCULAR | Status: DC
  Filled 2012-07-19 (×3): qty 2

## 2012-07-19 MED ORDER — BOOST PLUS PO LIQD
237.0000 mL | ORAL | Status: DC
  Administered 2012-07-20 – 2012-07-24 (×5): 237 mL via ORAL
  Filled 2012-07-19 (×6): qty 237

## 2012-07-19 NOTE — Consult Note (Signed)
No acute surgical indications for cholecystectomy - gallstone seems to be asymptomatic  Persistent hiccups may be caused by some type of diaphragmatic/ phrenic nerve irritation - infection, inflammation, hiatal hernia.  I do not see a significant hiatal hernia on CT scan, or any subphrenic fluid collections or inflammation.  The patient is at elevated risk for complications from surgery at this time due to his comorbidities, and there are no acute indications.  Disposition per primary team.  Wilmon Arms. Corliss Skains, MD, [facility]  07/19/2012 12:50 PM

## 2012-07-19 NOTE — Progress Notes (Signed)
Patient ID: Kirk Townsend, male   DOB: 1928-07-29, 79 y.o.   MRN: [id number]  [facility] PROGRESS NOTE  DIANNE BADY ION:[id number] DOB: 1928/08/16 DOA: 07/12/2012 PCP: Londell Moh, MD  Brief narrative:  Pt is a 79 y.o. male with history of CAD, status post angioplasty, narcolepsy, hypertension, CHF, chronic kidney disease, who was brought to the ER because of nausea and vomiting. Patient has been having hiccups for last 2 weeks, associated with poor oral intake and generalized malaise. On 03/21, pt has developed acute respiratory failure with hypoxia and CXR has demonstrated development on right upper lobe PNA with development of CHF. Pt started on broad spectrum ABX to cover for presumptive HCAP. One dose of Lasix IV given 07/15/2012.   In the ER patient had CT abdomen and pelvis which showed features for possible gastroenteritis, cholelithiasis. Lipase elevated at 91 on admission.   Principal Problem:  Acute hypoxic respiratory failure  - this is likely multifactorial and secondary to development of PNA and CHF, pt clinically improving and not in acute distress  - continue PO Levaquin - one dose of Lasix given 03/21, will hold of on Lasix for now due to overall euvolemic state and hyponatremia acute renal failure  - will monitor I's and O's and daily weights  Nausea & vomiting with hiccups  - possibly related to acute pancreatitis vs cholelithiasis  - pt reports feeling better and lipase is now up again from normal baseline, pt tolerating regular diet well, no vomiting, repeat lipase in AM  - on Abdominal US cholelithiasis noted  - will continue supportive care for now as recommended by GI specialist, IVF, analgesia and antiemetics as needed  - will ask surgery for input, ? Need for surgical intervention as family insisting on surgery, I explained that there is no need for surgical intervention at this time   - treat hiccups symptomatically with thorazine  - continue  regular diet  Active Problems:  Bladder outlet obstruction  - PRUV > 600 after bladder scan, placed foley 03/24 and will continue to keep in place  Slurred speech 03/20  - MRI of the brain negative for acute stroke, however old progressive chronic ischemic changes noted  - pt is at baseline mental status and overall non focal physical exam  Chronic CHF, combined systolic and diastolic  - last EF 45% (08/2011)  - repeat 2 D ECHO indicating EF 50% (07/13/2012)  - monitor daily weights, I's and O's  CKD (chronic kidney disease)  - based on GFR stage II - III  - creatinine is now within normal limits  - BMP in AM  Hypokalemia  - supplemented and within normal limits this AM   Consultants:  GI consultation over the phone  Surgery  Procedures/Studies:  Mr Brain Wo Contrast 07/14/2012  No acute infarct. No acute intracranial abnormality identified. Cerebral volume loss and mild to moderate nonspecific cerebral white matter signal changes.The examination had to be discontinued prior to completion.  Dg Chest Port 1 View 07/15/2012  Asymmetric airspace consolidation in the right upper lobe concerning for pneumonia. Cardiomegaly with cephalization of the pulmonary vasculature and diffuse interstitial prominence suggestive of background of mild interstitial pulmonary edema, possibly related to congestive heart failure.  Dg Chest 2 View 07/12/2012  Stable COPD/emphysema and stable mild cardiomegaly. No acute cardiopulmonary disease.  US Abdomen Complete 07/13/2012  Solitary gallstone. No findings of cholecystitis. Minimal prominence of the pancreatic duct, nonspecific. Otherwise, normal appearing abdomen.  Ct Abdomen Pelvis W Contrast  07/12/2012  Cholelithiasis. Suggestion of small bowel wall thickening involving the jejunum and liquid stool in the colon. Changes likely to represent gastroenteritis or other inflammatory process. Mild fatty infiltration of the liver.  Antibiotics:  Vancomycin 03/22 ->  03/24  Levaquin 03/22 -> changed to PO 03/24 -->  Maxipime 03/22 --> 03/24  Code Status: Full  Family Communication: Pt, daughter, and wife at bedside  Disposition Plan: SNF likely in AM   HPI/Subjective: No events overnight.   Objective: Filed Vitals:   07/19/12 0436 07/19/12 1030 07/19/12 1310 07/19/12 1413  BP: 151/60 169/83 153/68 149/64  Pulse: 72 83 76 74  Temp: 98.5 F (36.9 C) 98.9 F (37.2 C)  97.9 F (36.6 C)  TempSrc: Axillary Oral  Oral  Resp: 18 18  18   Height:      Weight:      SpO2: 100% 97%  98%    Intake/Output Summary (Last 24 hours) at 07/19/12 1724 Last data filed at 07/19/12 1600  Gross per 24 hour  Intake   1135 ml  Output   1275 ml  Net   -140 ml    Exam:   General:  Pt is alert, follows commands appropriately, not in acute distress, frail and chronically ill appearing   Cardiovascular: Regular rate and rhythm, S1/S2, no murmurs, no rubs, no gallops  Respiratory: Clear to auscultation bilaterally, no wheezing, no crackles, no rhonchi  Abdomen: Soft, non tender, non distended, bowel sounds present, no guarding  Extremities: No edema, pulses DP and PT palpable bilaterally  Neuro: Grossly nonfocal  Data Reviewed: Basic Metabolic Panel:  Recent Labs Lab 07/14/12 0415 07/15/12 0417 07/16/12 0434 07/17/12 0446 07/18/12 0410 07/19/12 0449  NA 135 132* 130* 132* 133*  --   K 3.7 3.3* 3.4* 3.0* 3.6  --   CL 103 99 95* 97 101  --   CO2 21 23 22 23 22   --   GLUCOSE 84 114* 108* 96 107*  --   BUN 18 15 17  27* 28*  --   CREATININE 1.24 1.21 1.20 1.41* 1.30 1.21  CALCIUM 8.6 8.7 9.0 8.6 8.8  --    Liver Function Tests:  Recent Labs Lab 07/12/12 1910 07/13/12 0210 07/19/12 0449  AST 29 30 62*  ALT 18 19 48  ALKPHOS 69 65 97  BILITOT 0.3 0.3 0.4  PROT 5.9* 5.8* 5.6*  ALBUMIN 3.1* 3.1* 2.3*    Recent Labs Lab 07/12/12 1910 07/13/12 0210 07/14/12 0415 07/19/12 0449  LIPASE 91* 38 26 67*   CBC:  Recent Labs Lab  07/12/12 1910  07/15/12 0417 07/16/12 0434 07/17/12 0446 07/18/12 0410 07/19/12 0449  WBC 6.0  < > 7.6 9.6 8.7 8.5 9.6  NEUTROABS 5.0  --   --   --   --   --   --   HGB 13.4  < > 13.1 13.2 12.0* 12.2* 12.1*  HCT 40.1  < > 38.8* 39.0 33.7* 34.5* 34.7*  MCV 99.0  < > 95.6 94.0 93.6 93.8 95.3  PLT 204  < > 193 199 197 271 309  < > = values in this interval not displayed.  Cardiac Enzymes:  Recent Labs Lab 07/12/12 1910 07/13/12 0209 07/13/12 0843 07/13/12 1345  TROPONINI <0.30 <0.30 <0.30 <0.30   Recent Results (from the past 240 hour(s))  CULTURE, BLOOD (ROUTINE X 2)     Status: None   Collection Time    07/16/12  7:40 AM      Result Value  Range Status   Specimen Description BLOOD LEFT ARM   Final   Special Requests BOTTLES DRAWN AEROBIC AND ANAEROBIC 5CC   Final   Culture  Setup Time 07/16/2012 14:49   Final   Culture     Final   Value:        BLOOD CULTURE RECEIVED NO GROWTH TO DATE CULTURE WILL BE HELD FOR 5 DAYS BEFORE ISSUING A FINAL NEGATIVE REPORT   Report Status PENDING   Incomplete  CULTURE, BLOOD (ROUTINE X 2)     Status: None   Collection Time    07/16/12  7:45 AM      Result Value Range Status   Specimen Description BLOOD RIGHT ARM   Final   Special Requests BOTTLES DRAWN AEROBIC AND ANAEROBIC 5CC   Final   Culture  Setup Time 07/16/2012 14:49   Final   Culture     Final   Value:        BLOOD CULTURE RECEIVED NO GROWTH TO DATE CULTURE WILL BE HELD FOR 5 DAYS BEFORE ISSUING A FINAL NEGATIVE REPORT   Report Status PENDING   Incomplete    Scheduled Meds: . amphetamine-dextroam  20 mg Oral BID AC  . Armodafinil  250 mg Oral Daily  . aspirin  81 mg Oral Daily  . bisoprolol  5 mg Oral Daily  . buPROPion  150 mg Oral Daily  . donepezil  5 mg Oral QHS  . enoxaparin  injection  40 mg Subcutaneous Q24H  . feeding supplement  1 Container Oral Q24H  . levofloxacin  500 mg Oral Daily  . multivitamin   1 tablet Oral Daily  . pantoprazole  40 mg Oral BID AC  .  sertraline  50 mg Oral Daily  . Sodium Oxybate  4,000 mg Oral Custom   Continuous Infusions:   Debbora Presto, MD  [facility] Pager [phone]  If 7PM-7AM, please contact night-coverage [email or web address] Password [facility] 07/19/2012, 5:24 PM   LOS: 7 days

## 2012-07-19 NOTE — Progress Notes (Signed)
CSW met with patient's daughter, they are requesting [facility]. CSW left message with admissions. Willford Rabideau C. Angas Isabell MSW, LCSW [phone]

## 2012-07-19 NOTE — Progress Notes (Signed)
Physical Therapy Treatment Patient Details Name: Kirk Townsend MRN: [id number] DOB: [birth year] Today's Date: 07/19/2012 Time: 1336-1400 PT Time Calculation (min): 24 min  PT Assessment / Plan / Recommendation Comments on Treatment Session  Pt ambulated in hallway with RW and min assist and able to perform exercises in recliner today.  Pt pleased with ambulation and happy to be up walking.    Follow Up Recommendations  Supervision/Assistance - 24 hour;SNF     Does the patient have the potential to tolerate intense rehabilitation     Barriers to Discharge        Equipment Recommendations  None recommended by PT    Recommendations for Other Services    Frequency     Plan Discharge plan remains appropriate;Frequency remains appropriate    Precautions / Restrictions Precautions Precautions: Fall   Pertinent Vitals/Pain n/a    Mobility  Bed Mobility Bed Mobility: Not assessed Transfers Transfers: Sit to Stand;Stand to Sit Sit to Stand: From chair/3-in-1;4: Min assist;With upper extremity assist Stand to Sit: To chair/3-in-1;4: Min guard;With upper extremity assist Details for Transfer Assistance: verbal cues for hand placement and backing up to chair with RW Ambulation/Gait Ambulation/Gait Assistance: 4: Min assist Ambulation Distance (Feet): 200 Feet Assistive device: Rolling walker Ambulation/Gait Assistance Details: assist for weakness and steadying, ambulated on 2L O2 [place] with SaO2 94% upon return to recliner Gait Pattern: Step-through pattern;Decreased stride length;Narrow base of support    Exercises General Exercises - Lower Extremity Ankle Circles/Pumps: AROM;Both;15 reps Long Arc Quad: AROM;Both;15 reps;Seated Hip ABduction/ADduction: AROM;Both;15 reps Straight Leg Raises: AROM;Both;15 reps Hip Flexion/Marching: AROM;Both;Seated Mini-Sqauts: AROM;10 reps (sit to stands using armrests of recliner)   PT Diagnosis:    PT Problem List:   PT Treatment  Interventions:     PT Goals Acute Rehab PT Goals PT Goal: Sit to Stand - Progress: Progressing toward goal PT Goal: Stand to Sit - Progress: Progressing toward goal PT Goal: Ambulate - Progress: Progressing toward goal PT Goal: Perform Home Exercise Program - Progress: Progressing toward goal  Visit Information  Last PT Received On: 07/19/12 Assistance Needed: +1    Subjective Data  Subjective: It feels good to walk.   Cognition  Cognition Overall Cognitive Status: Appears within functional limits for tasks assessed/performed Arousal/Alertness: Awake/alert Orientation Level: Appears intact for tasks assessed Behavior During Session: [facility] for tasks performed    Balance     End of Session PT - End of Session Equipment Utilized During Treatment: Oxygen Activity Tolerance: Patient tolerated treatment well Patient left: in chair;with call bell/phone within reach;with family/visitor present   GP     Lakela Kuba,KATHrine E 07/19/2012, 3:54 PM Zenovia Jarred, PT, DPT 07/19/2012 Pager: [phone]

## 2012-07-19 NOTE — Progress Notes (Signed)
NUTRITION FOLLOW UP  Intervention:   Provide Resource Breeze once daily, each supplement provides 250 kcal and 9 grams of protein.  Provide Boost once daily Provide Multivitamin with minerals daily   Nutrition Dx:   Unintentional wt loss related to nausea, vomiting and hiccups as evidenced by 5% wt loss in 2 weeks; improving   Goal:   Pt to meet >/= 90% of their estimated nutrition needs; being met most days   Monitor:   Po intake; improving -100% of meals for the past 2 days Wt; 2 lb wt gain from 3/19 to 3/25  Assessment:   79 year old male with history of CAD, status post angioplasty, narcolepsy, hypertension, CHF, chronic kidney disease, who was brought to the ER because of nausea and vomiting. Patient has been having hiccups for last 2 weeks, associated with poor oral intake and generalized malaise. In the ER patient had CT abdomen and pelvis which showed features for possible gastroenteritis, cholelithiasis.  3/19: Pt asleep at time of visit, unable to awaken. Wife at bedside reports pt has had difficulty eating for the past 2 weeks due to hiccups and then N/V. Wife reports pt usually weighs around 150 lbs and eats 4 small meals daily with Boost supplements occasionally; follows a low salt diet. Per wife pt's nausea and hiccups have improved with medications and pt has had no vomiting episodes so far today. Pt is tolerating clear liquids. 3/25 Pt reports nausea today and hiccups causing poor po intake today. Per family in the room pt was eating 100% of 3 meals daily for the past 2 days but, po intake decreased today due to hiccups. Pt has been drinking one [employer] daily.   Height: Ht Readings from Last 1 Encounters:  07/13/12 5\' 7"  (1.702 m)    Weight Status:   Wt Readings from Last 1 Encounters:  07/19/12 145 lb 11.2 oz (66.089 kg)    Re-estimated needs:  Kcal: 1630-1830  Protein: 65-78 grams  Fluid: 1.9 L  Skin: WDL  Diet Order: General   Intake/Output  Summary (Last 24 hours) at 07/19/12 1626 Last data filed at 07/19/12 1600  Gross per 24 hour  Intake   1135 ml  Output    725 ml  Net    410 ml    Last BM: 3/25   Labs:   Recent Labs Lab 07/16/12 0434 07/17/12 0446 07/18/12 0410 07/19/12 0449  NA 130* 132* 133*  --   K 3.4* 3.0* 3.6  --   CL 95* 97 101  --   CO2 22 23 22   --   BUN 17 27* 28*  --   CREATININE 1.20 1.41* 1.30 1.21  CALCIUM 9.0 8.6 8.8  --   GLUCOSE 108* 96 107*  --     CBG (last 3)  No results found for this basename: GLUCAP,  in the last 72 hours  Scheduled Meds: . amphetamine-dextroamphetamine  20 mg Oral BID AC  . Armodafinil  250 mg Oral Daily  . aspirin  81 mg Oral Daily  . bisoprolol  5 mg Oral Daily  . buPROPion  150 mg Oral Daily  . donepezil  5 mg Oral QHS  . enoxaparin (LOVENOX) injection  40 mg Subcutaneous Q24H  . feeding supplement  1 Container Oral BID BM  . levofloxacin  500 mg Oral Daily  . multivitamin with minerals  1 tablet Oral Daily  . pantoprazole  40 mg Oral BID AC  . sertraline  50 mg  Oral Daily  . sodium chloride  3 mL Intravenous Q12H  . Sodium Oxybate  4,000 mg Oral Custom     Ian Malkin RD, LDN Inpatient Clinical Dietitian Pager: [phone] After Hours Pager: [phone]

## 2012-07-19 NOTE — Consult Note (Signed)
Reason for Consult: Cholelithiasis; patient's chief complaint is ongoing hiccupsReferring Physician: DR. Charline Bills  Kirk Townsend is an 80 y.o. male.  HPI: Patient is an 78 year old male who was brought to the hospital on 318/14 with complaints of hiccups, nausea and vomiting it was felt this was probably secondary to gastroenteritis.  LFTs were normal on admission lipase was normal on admission and went up to 67 on 07/19/12. WBC on admission was 5.8, continues to remain normal at 9.6 today. H./H. also remains essentially normal. Chest x-ray on admission shows stable COPD/emphysema and cardiomegaly no acute pulmonary changes. MRI of the brain showed no acute infarct no acute intracranial abnormalities some volume loss. Abdominal ultrasound shows gallbladder with 14 mm gallstone which moves with change in the patient's position. Gallbladder wall was not thickened Murphy's sign was negative. Common bile duct was 5.4 mm. This was not suggestive of cholecystitis. CT of the abdomen shows cholelithiasis with suggestion of small bowel wall thickening involving the jejunum was some liquid stool in the colon changes consistent with gastric enteritis there is also some mild fatty liver infiltration. Repeat single view x-ray on 07/15/12, shows some  consolidation in the right upper lobe concerning for pneumonia, and possible pulmonary edema. Patient continues to complain primarily of hiccups lasting several hours at that time. The only thing that seems to stop it is IV Thorazine. GI recommended possible surgical consult for cholelithiasis.    Past Medical History  Diagnosis Date  . Coronary artery disease   . Hypertension   . Renal disorder   . High cholesterol   . Narcolepsy   . Myocardial infarction     1994  . Barrett's esophagus     Past Surgical History  Procedure Laterality Date  . Hernia repair    . Coronary angioplasty      1994. No stent.    History reviewed. No pertinent family  history.  Social History:  reports that he quit smoking about 55 years ago. He has never used smokeless tobacco. He reports that he does not drink alcohol or use illicit drugs.  Allergies:  Allergies  Allergen Reactions  . Metoprolol Other (See Comments)    Stomach cramps, gas, diarrhea    Medications:  Prior to Admission:  Prescriptions prior to admission  Medication Sig Dispense Refill  . amphetamine-dextroamphetamine (ADDERALL) 10 MG tablet Take 20 mg by mouth 2 (two) times daily.       . Armodafinil (NUVIGIL) 250 MG tablet Take 250 mg by mouth daily.      Marland Kitchen aspirin 81 MG chewable tablet Chew 81 mg by mouth daily.      . bisoprolol (ZEBETA) 5 MG tablet Take 5 mg by mouth daily.      Marland Kitchen buPROPion (WELLBUTRIN XL) 150 MG 24 hr tablet Take 150 mg by mouth daily.      . calcium carbonate (OS-CAL - DOSED IN MG OF ELEMENTAL CALCIUM) 1250 MG tablet Take 1 tablet by mouth daily.      Marland Kitchen donepezil (ARICEPT) 5 MG tablet Take 5 mg by mouth at bedtime.      . furosemide (LASIX) 20 MG tablet Take 20 mg by mouth daily.      . Multiple Vitamin (MULITIVITAMIN WITH MINERALS) TABS Take 1 tablet by mouth daily.      . pantoprazole (PROTONIX) 40 MG tablet Take 40 mg by mouth daily.      . sertraline (ZOLOFT) 50 MG tablet Take 50 mg by mouth daily.      Marland Kitchen  simvastatin (ZOCOR) 20 MG tablet Take 20 mg by mouth every evening.      . Sodium Oxybate (XYREM PO) Take 4 g by mouth 2 (two) times daily.       . vitamin C (ASCORBIC ACID) 500 MG tablet Take 500 mg by mouth daily.       Scheduled: . amphetamine-dextroamphetamine  20 mg Oral BID AC  . Armodafinil  250 mg Oral Daily  . aspirin  81 mg Oral Daily  . bisoprolol  5 mg Oral Daily  . buPROPion  150 mg Oral Daily  . donepezil  5 mg Oral QHS  . enoxaparin (LOVENOX) injection  40 mg Subcutaneous Q24H  . feeding supplement  1 Container Oral BID BM  . levofloxacin  500 mg Oral Daily  . multivitamin with minerals  1 tablet Oral Daily  . pantoprazole  40 mg  Oral BID AC  . sertraline  50 mg Oral Daily  . sodium chloride  3 mL Intravenous Q12H  . Sodium Oxybate  4,000 mg Oral Custom   Continuous:  YNW:[id number], acetaminophen, chlorproMAZINE, hydrALAZINE, morphine injection, ondansetron (ZOFRAN) IV, ondansetron, promethazine Anti-infectives   Start     Dose/Rate Route Frequency Ordered Stop   07/19/12 1000  levofloxacin (LEVAQUIN) tablet 500 mg     500 mg Oral Daily 07/18/12 1334     07/18/12 0900  levofloxacin (LEVAQUIN) IVPB 750 mg     750 mg 100 mL/hr over 90 Minutes Intravenous Every 48 hours 07/16/12 1201 07/18/12 1054   07/16/12 2200  ceFEPIme (MAXIPIME) 1 g in dextrose 5 % 50 mL IVPB  Status:  Discontinued     1 g 100 mL/hr over 30 Minutes Intravenous Every 12 hours 07/16/12 1201 07/18/12 1334   07/16/12 0700  levofloxacin (LEVAQUIN) IVPB 750 mg  Status:  Discontinued     750 mg 100 mL/hr over 90 Minutes Intravenous Daily 07/16/12 0644 07/16/12 1201   07/16/12 0700  vancomycin (VANCOCIN) IVPB 1000 mg/200 mL premix  Status:  Discontinued     1,000 mg 200 mL/hr over 60 Minutes Intravenous Daily before breakfast 07/16/12 0657 07/18/12 1334   07/16/12 0645  ceFEPIme (MAXIPIME) 1 g in dextrose 5 % 50 mL IVPB  Status:  Discontinued     1 g 100 mL/hr over 30 Minutes Intravenous 3 times per day 07/16/12 4696 07/16/12 1201      Results for orders placed during the hospital encounter of 07/12/12 (from the past 48 hour(s))  LEGIONELLA ANTIGEN, URINE     Status: None   Collection Time    07/17/12 11:26 AM      Result Value Range   Specimen Description URINE, CLEAN CATCH     Special Requests NONE     Legionella Antigen, Urine Negative for Legionella pneumophilia serogroup 1     Report Status 07/18/2012 FINAL    STREP PNEUMONIAE URINARY ANTIGEN     Status: None   Collection Time    07/17/12 11:26 AM      Result Value Range   Strep Pneumo Urinary Antigen NEGATIVE  NEGATIVE   Comment:            Infection due to S. pneumoniae      cannot be absolutely ruled out     since the antigen present     may be below the detection limit     of the test.  CBC     Status: Abnormal   Collection Time    07/18/12  4:10 AM  Result Value Range   WBC 8.5  4.0 - 10.5 K/uL   RBC 3.68 (*) 4.22 - 5.81 MIL/uL   Hemoglobin 12.2 (*) 13.0 - 17.0 g/dL   HCT 16.1 (*) 09.6 - 04.5 %   MCV 93.8  78.0 - 100.0 fL   MCH 33.2  26.0 - 34.0 pg   MCHC 35.4  30.0 - 36.0 g/dL   RDW 40.9  81.1 - 91.4 %   Platelets 271  150 - 400 K/uL   Comment: DELTA CHECK NOTED     REPEATED TO VERIFY  BASIC METABOLIC PANEL     Status: Abnormal   Collection Time    07/18/12  4:10 AM      Result Value Range   Sodium 133 (*) 135 - 145 mEq/L   Potassium 3.6  3.5 - 5.1 mEq/L   Chloride 101  96 - 112 mEq/L   CO2 22  19 - 32 mEq/L   Glucose, Bld 107 (*) 70 - 99 mg/dL   BUN 28 (*) 6 - 23 mg/dL   Creatinine, Ser 7.82  0.50 - 1.35 mg/dL   Calcium 8.8  8.4 - 95.6 mg/dL   GFR calc non Af Amer 50 (*) >90 mL/min   GFR calc Af Amer 58 (*) >90 mL/min   Comment:            The eGFR has been calculated     using the CKD EPI equation.     This calculation has not been     validated in all clinical     situations.     eGFR's persistently     <90 mL/min signify     possible Chronic Kidney Disease.  CREATININE, SERUM     Status: Abnormal   Collection Time    07/19/12  4:49 AM      Result Value Range   Creatinine, Ser 1.21  0.50 - 1.35 mg/dL   GFR calc non Af Amer 54 (*) >90 mL/min   GFR calc Af Amer 63 (*) >90 mL/min   Comment:            The eGFR has been calculated     using the CKD EPI equation.     This calculation has not been     validated in all clinical     situations.     eGFR's persistently     <90 mL/min signify     possible Chronic Kidney Disease.  CBC     Status: Abnormal   Collection Time    07/19/12  4:49 AM      Result Value Range   WBC 9.6  4.0 - 10.5 K/uL   RBC 3.64 (*) 4.22 - 5.81 MIL/uL   Hemoglobin 12.1 (*) 13.0 - 17.0 g/dL   HCT  21.3 (*) 08.6 - 52.0 %   MCV 95.3  78.0 - 100.0 fL   MCH 33.2  26.0 - 34.0 pg   MCHC 34.9  30.0 - 36.0 g/dL   RDW 57.8  46.9 - 62.9 %   Platelets 309  150 - 400 K/uL  LIPASE, BLOOD     Status: Abnormal   Collection Time    07/19/12  4:49 AM      Result Value Range   Lipase 67 (*) 11 - 59 U/L  HEPATIC FUNCTION PANEL     Status: Abnormal   Collection Time    07/19/12  4:49 AM      Result Value Range   Total  Protein 5.6 (*) 6.0 - 8.3 g/dL   Albumin 2.3 (*) 3.5 - 5.2 g/dL   AST 62 (*) 0 - 37 U/L   ALT 48  0 - 53 U/L   Alkaline Phosphatase 97  39 - 117 U/L   Total Bilirubin 0.4  0.3 - 1.2 mg/dL   Bilirubin, Direct 0.1  0.0 - 0.3 mg/dL   Indirect Bilirubin 0.3  0.3 - 0.9 mg/dL    No results found.  Review of Systems  Constitutional: Negative.        His wife says he's lost some weight over the last 4 months, but it does not sound acute.  HENT: Positive for hearing loss (he has hearing aids for both ears, but can hear me with conversation quite well.  No hearing aids in place.).   Eyes: Negative.   Respiratory: Negative for cough, hemoptysis, sputum production, shortness of breath and wheezing.        Narcolepsy, still naps allot, but much better per family with medicines.   Cardiovascular: Positive for leg swelling (better since "lady doctor treated him."). Negative for palpitations, orthopnea, claudication and PND.  Gastrointestinal: Positive for heartburn, nausea, vomiting and diarrhea.       He had nausea and some vomiting day of admit, but none since that time.  Bowels and po intake is without issue.  He didn't eat this Am because hiccups were so bad he can't/doesn't want to eat.  Genitourinary:       Slow  Musculoskeletal: Negative.   Skin: Negative.   Neurological:       It sounds like he has some memory issues, but wife says he does well.  He still drives for both of them.   When ask about stroke he thought it was sometime ago, wife said it was last summer.   Endo/Heme/Allergies: Negative.   Psychiatric/Behavioral: Negative.        Narcolepsy still an issue, but less so on medicines.   Blood pressure 151/60, pulse 72, temperature 98.5 F (36.9 C), temperature source Axillary, resp. rate 18, height 5\' 7"  (1.702 m), weight 145 lb 11.2 oz (66.089 kg), SpO2 100.00%. Physical Exam  Constitutional: He is oriented to person, place, and time. No distress.  Elderly frail, deconditioned WM, NAD  HENT:  Head: Normocephalic and atraumatic.  Nose: Nose normal.  Mouth/Throat: No oropharyngeal exudate.  Eyes: Conjunctivae and EOM are normal. Pupils are equal, round, and reactive to light. Right eye exhibits no discharge. Left eye exhibits no discharge. No scleral icterus.  BILAT ARCUS  Neck: Normal range of motion. Neck supple. No JVD present. No tracheal deviation present. No thyromegaly present.  Cardiovascular: Regular rhythm, normal heart sounds and intact distal pulses.  Exam reveals no gallop.   No murmur heard. SL TACHYCARDIA  Respiratory: Effort normal and breath sounds normal. No stridor. No respiratory distress. He has no wheezes. He has no rales. He exhibits no tenderness.  GI: Soft. Bowel sounds are normal. He exhibits no distension and no mass. There is no tenderness. There is no rebound and no guarding.  Musculoskeletal: He exhibits no edema and no tenderness.  Lymphadenopathy:    He has no cervical adenopathy.  Neurological: He is alert and oriented to person, place, and time. No cranial nerve deficit.  Skin: Skin is warm and dry. No rash noted. He is not diaphoretic. No erythema. No pallor.  Psychiatric: He has a normal mood and affect. His behavior is normal. Judgment and thought content normal.  Assessment/Plan: 1. Hiccups 2. Resolved nausea and vomiting 3. Cholelithiasis, normal LFTs, single elevation of lipase. 4. Narcolepsy on multiple medications with some ongoing daytime napping. 5. Deconditioning and weakness after several  days of hospitalization. 6. History of coronary disease with prior PTCA is no stents/MI in 1994. 7. History of dyslipidemia 8. CVA/TIA last summer 9. History of Barrett's esophagus  Plan: I will discuss with Dr. Corliss Skains make further recommendations. [facility] physician assistant for Dr. Manus Rudd.  Polo Mcmartin 07/19/2012, 11:16 AM

## 2012-07-20 LAB — CBC
HCT: 31.2 % — ABNORMAL LOW (ref 39.0–52.0)
MCV: 94.8 fL (ref 78.0–100.0)
Platelets: 318 10*3/uL (ref 150–400)
RBC: 3.29 MIL/uL — ABNORMAL LOW (ref 4.22–5.81)
WBC: 10.8 10*3/uL — ABNORMAL HIGH (ref 4.0–10.5)

## 2012-07-20 LAB — BASIC METABOLIC PANEL
BUN: 19 mg/dL (ref 6–23)
CO2: 23 mEq/L (ref 19–32)
Calcium: 8.7 mg/dL (ref 8.4–10.5)
Chloride: 101 mEq/L (ref 96–112)
Creatinine, Ser: 1.18 mg/dL (ref 0.50–1.35)
GFR calc Af Amer: 65 mL/min — ABNORMAL LOW (ref >90)

## 2012-07-20 LAB — LIPASE, BLOOD: Lipase: 31 U/L (ref 11–59)

## 2012-07-20 MED ORDER — VITAMINS A & D EX OINT
TOPICAL_OINTMENT | CUTANEOUS | Status: AC
  Administered 2012-07-20: 02:00:00
  Filled 2012-07-20: qty 5

## 2012-07-20 MED ORDER — METOCLOPRAMIDE HCL 5 MG/ML IJ SOLN
10.0000 mg | Freq: Three times a day (TID) | INTRAMUSCULAR | Status: AC
  Administered 2012-07-20 – 2012-07-22 (×6): 10 mg via INTRAVENOUS
  Filled 2012-07-20 (×6): qty 2

## 2012-07-20 MED ORDER — FUROSEMIDE 20 MG PO TABS
20.0000 mg | ORAL_TABLET | Freq: Every day | ORAL | Status: DC
  Administered 2012-07-20 – 2012-07-22 (×3): 20 mg via ORAL
  Filled 2012-07-20 (×6): qty 1

## 2012-07-20 NOTE — Progress Notes (Addendum)
CSW has left multiple messages for [facility]. No return calls. CSW met with patient's daughter. If not bed at [facility], they would like to take patient home.  Kirk Townsend C. Kirk Townsend MSW, Kirk Townsend [phone] As of this time, there are no beds available at [facility]. CSW will check status in the AM. CSW communicated possible homecare needs to care manager.  Kirk Townsend C. Kirk Townsend MSW, LCSW [phone]

## 2012-07-20 NOTE — Progress Notes (Signed)
Physical Therapy Treatment Patient Details Name: Kirk Townsend MRN: [id number] DOB: June 04, 1928 Today's Date: 07/20/2012 Time: 4098-1191 PT Time Calculation (min): 26 min  PT Assessment / Plan / Recommendation Comments on Treatment Session  Pt ambulated in hallway and able to increase distance however SaO2 83% on 2L and pt SOB so increased to 3L ambulating back to room.  Pt also performed exercises in recliner and took rest breaks as needed.    Follow Up Recommendations  Supervision/Assistance - 24 hour;SNF     Does the patient have the potential to tolerate intense rehabilitation     Barriers to Discharge        Equipment Recommendations  None recommended by PT    Recommendations for Other Services    Frequency     Plan Discharge plan remains appropriate;Frequency remains appropriate    Precautions / Restrictions Precautions Precautions: Fall Precaution Comments: check sats   Pertinent Vitals/Pain SaO2 83% on 2L during ambulation SaO2 96% on 2L upon leaving room    Mobility  Bed Mobility Bed Mobility: Not assessed Transfers Transfers: Sit to Stand;Stand to Sit Sit to Stand: 4: Min assist;With upper extremity assist;From chair/3-in-1 Stand to Sit: 4: Min guard;To chair/3-in-1;With upper extremity assist Details for Transfer Assistance: min to steady with rise Ambulation/Gait Ambulation/Gait Assistance: 4: Min assist Ambulation Distance (Feet): 200 Feet Assistive device: Rolling walker Ambulation/Gait Assistance Details: initially min assist for steadying however quickly became min/guard, SaO2 83% on 2L as pt reports SOB so cued for pursed lip breathing during standing rest break and increased to 3L for ambulation returning to room, SaO2 94% upon rest in recliner so placed back on 2L O2 Gait Pattern: Step-through pattern;Decreased stride length;Narrow base of support General Gait Details: verbal cues for posture and RW distance    Exercises General Exercises - Lower  Extremity Ankle Circles/Pumps: AROM;Both;PROM;20 reps Long Arc Quad: AROM;Both;Seated;20 reps Hip ABduction/ADduction: AROM;Both;20 reps Straight Leg Raises: AROM;Both;15 reps Hip Flexion/Marching: AROM;Both;Seated Mini-Sqauts: AROM;10 reps;Other (comment) ( using armrests of recliner)   PT Diagnosis:    PT Problem List:   PT Treatment Interventions:     PT Goals Acute Rehab PT Goals PT Goal: Sit to Stand - Progress: Progressing toward goal PT Goal: Stand to Sit - Progress: Progressing toward goal PT Goal: Ambulate - Progress: Progressing toward goal PT Goal: Perform Home Exercise Program - Progress: Progressing toward goal  Visit Information  Last PT Received On: 07/20/12 Assistance Needed: +1    Subjective Data  Subjective: Can I have some tissues?   Cognition  Cognition Overall Cognitive Status: Appears within functional limits for tasks assessed/performed Arousal/Alertness: Awake/alert Orientation Level: Appears intact for tasks assessed Behavior During Session: [facility] for tasks performed    Balance     End of Session PT - End of Session Equipment Utilized During Treatment: Oxygen;Gait belt Activity Tolerance: Patient limited by fatigue Patient left: in chair;with call bell/phone within reach;with family/visitor present   GP     Randel Hargens,KATHrine E 07/20/2012, 3:32 PM Zenovia Jarred, PT, DPT 07/20/2012 Pager: [phone]

## 2012-07-20 NOTE — Progress Notes (Signed)
PT Cancellation Note  Patient Details Name: DANISH RUFFINS MRN: [id number] DOB: [birth year]   Cancelled Treatment:    Reason Eval/Treat Not Completed: Other (comment) Pt and family report pt hasn't slept in 2 days and pt would like to rest.   Zavian Slowey,KATHrine E 07/20/2012, 10:14 AM Pager: [phone]

## 2012-07-20 NOTE — Progress Notes (Signed)
[facility] PROGRESS NOTE  Kirk Townsend ZOX:[id number] DOB: 08-Feb-1929 DOA: 07/12/2012 PCP: Londell Moh, MD  Assessment/Plan: #1 acute hypoxic respiratory failure Multifactorial in nature secondary to pneumonia and CHF. Patient with clinical improvement. Patient has been transitioned to oral Levaquin. Patient received 1 dose of IV Lasix on 07/15/2012. Will resume patient back on his home dose Lasix. Follow.  #2 nausea and vomiting with hiccups Unknown etiology. May have been secondary to acute pancreatitis. Patient with no further nausea or vomiting and tolerating oral intake. Lipase is at baseline. Abdominal ultrasound cholelithiasis however no acute cholecystitis. Patient has been seen by general surgery and no further workup is needed at this time. Patient with significant hiccups at this time. We'll place on scheduled Reglan IV and Thorazine as needed. Follow.  #3 bladder outlet obstruction Continue Foley catheter. Will need outpatient urology followup.  #4 slurred speech on 07/14/2012 Resolved. MRI negative. Patient at baseline. Follow.  #5 chronic combined systolic and diastolic CHF EF of 45% in May of 2013. Repeat 2-D echo with EF of 50% 07/13/2012. Will resume home dose Lasix. Follow.  #6 chronic kidney disease stage II to III Stable.  #7 hypokalemia Repleted.  Code Status: Full Family Communication: Updated patient, wife and daughter at bedside. Disposition Plan: SNF when medically stable   Consultants: GI consultation over the phone per Dr Izola Price Surgery    Procedures: Mr Brain Wo Contrast 07/14/2012  No acute infarct. No acute intracranial abnormality identified. Cerebral volume loss and mild to moderate nonspecific cerebral white matter signal changes.The examination had to be discontinued prior to completion.  Dg Chest Port 1 View 07/15/2012  Asymmetric airspace consolidation in the right upper lobe concerning for pneumonia. Cardiomegaly with  cephalization of the pulmonary vasculature and diffuse interstitial prominence suggestive of background of mild interstitial pulmonary edema, possibly related to congestive heart failure.  Dg Chest 2 View 07/12/2012  Stable COPD/emphysema and stable mild cardiomegaly. No acute cardiopulmonary disease.  US Abdomen Complete 07/13/2012  Solitary gallstone. No findings of cholecystitis. Minimal prominence of the pancreatic duct, nonspecific. Otherwise, normal appearing abdomen.  Ct Abdomen Pelvis W Contrast 07/12/2012  Cholelithiasis. Suggestion of small bowel wall thickening involving the jejunum and liquid stool in the colon. Changes likely to represent gastroenteritis or other inflammatory process. Mild fatty infiltration of the liver.   Antibiotics Vancomycin 03/22 -> 03/24  Levaquin 03/22 -> changed to PO 03/24 -->  Maxipime 03/22 --> 03/24      HPI/Subjective: Patient complaining of hiccups. Patient denies any shortness of breath. Patient denies CP.  Objective: Filed Vitals:   07/19/12 1413 07/19/12 2102 07/20/12 0501 07/20/12 0715  BP: 149/64 151/56 149/65 154/66  Pulse: 74 73 71 90  Temp: 97.9 F (36.6 C) 98.2 F (36.8 C) 98 F (36.7 C) 99.7 F (37.6 C)  TempSrc: Oral Oral Axillary Oral  Resp: 18 16 16 18   Height:      Weight:   64.955 kg (143 lb 3.2 oz)   SpO2: 98% 98% 98% 95%    Intake/Output Summary (Last 24 hours) at 07/20/12 1201 Last data filed at 07/20/12 0900  Gross per 24 hour  Intake    990 ml  Output   1525 ml  Net   -535 ml   Filed Weights   07/18/12 0623 07/19/12 0433 07/20/12 0501  Weight: 65.4 kg (144 lb 2.9 oz) 66.089 kg (145 lb 11.2 oz) 64.955 kg (143 lb 3.2 oz)    Exam:   General:  NAD  Cardiovascular:  RRR  Respiratory: CTAB anterior lung fields  Abdomen: Soft/NT/ND/+BS  EXT: No c/c/e  Data Reviewed: Basic Metabolic Panel:  Recent Labs Lab 07/15/12 0417 07/16/12 0434 07/17/12 0446 07/18/12 0410 07/19/12 0449 07/20/12 0458  NA  132* 130* 132* 133*  --  135  K 3.3* 3.4* 3.0* 3.6  --  3.9  CL 99 95* 97 101  --  101  CO2 23 22 23 22   --  23  GLUCOSE 114* 108* 96 107*  --  111*  BUN 15 17 27* 28*  --  19  CREATININE 1.21 1.20 1.41* 1.30 1.21 1.18  CALCIUM 8.7 9.0 8.6 8.8  --  8.7   Liver Function Tests:  Recent Labs Lab 07/19/12 0449  AST 62*  ALT 48  ALKPHOS 97  BILITOT 0.4  PROT 5.6*  ALBUMIN 2.3*    Recent Labs Lab 07/14/12 0415 07/19/12 0449 07/20/12 0458  LIPASE 26 67* 31   No results found for this basename: AMMONIA,  in the last 168 hours CBC:  Recent Labs Lab 07/16/12 0434 07/17/12 0446 07/18/12 0410 07/19/12 0449 07/20/12 0458  WBC 9.6 8.7 8.5 9.6 10.8*  HGB 13.2 12.0* 12.2* 12.1* 10.8*  HCT 39.0 33.7* 34.5* 34.7* 31.2*  MCV 94.0 93.6 93.8 95.3 94.8  PLT 199 197 271 309 318   Cardiac Enzymes:  Recent Labs Lab 07/13/12 1345  TROPONINI <0.30   BNP (last 3 results)  Recent Labs  09/01/11 1500  PROBNP 2960.0*   CBG: No results found for this basename: GLUCAP,  in the last 168 hours  Recent Results (from the past 240 hour(s))  CULTURE, BLOOD (ROUTINE X 2)     Status: None   Collection Time    07/16/12  7:40 AM      Result Value Range Status   Specimen Description BLOOD LEFT ARM   Final   Special Requests BOTTLES DRAWN AEROBIC AND ANAEROBIC 5CC   Final   Culture  Setup Time 07/16/2012 14:49   Final   Culture     Final   Value:        BLOOD CULTURE RECEIVED NO GROWTH TO DATE CULTURE WILL BE HELD FOR 5 DAYS BEFORE ISSUING A FINAL NEGATIVE REPORT   Report Status PENDING   Incomplete  CULTURE, BLOOD (ROUTINE X 2)     Status: None   Collection Time    07/16/12  7:45 AM      Result Value Range Status   Specimen Description BLOOD RIGHT ARM   Final   Special Requests BOTTLES DRAWN AEROBIC AND ANAEROBIC 5CC   Final   Culture  Setup Time 07/16/2012 14:49   Final   Culture     Final   Value:        BLOOD CULTURE RECEIVED NO GROWTH TO DATE CULTURE WILL BE HELD FOR 5 DAYS  BEFORE ISSUING A FINAL NEGATIVE REPORT   Report Status PENDING   Incomplete     Studies: No results found.  Scheduled Meds: . amphetamine-dextroamphetamine  20 mg Oral BID AC  . Armodafinil  250 mg Oral Daily  . aspirin  81 mg Oral Daily  . bisoprolol  5 mg Oral Daily  . buPROPion  150 mg Oral Daily  . donepezil  5 mg Oral QHS  . enoxaparin (LOVENOX) injection  40 mg Subcutaneous Q24H  . feeding supplement  1 Container Oral Q24H  . lactose free nutrition  237 mL Oral Q24H  . levofloxacin  500 mg Oral Daily  .  metoCLOPramide (REGLAN) injection  10 mg Intravenous Q8H  . multivitamin with minerals  1 tablet Oral Daily  . pantoprazole  40 mg Oral BID AC  . sertraline  50 mg Oral Daily  . sodium chloride  3 mL Intravenous Q12H  . Sodium Oxybate  4,000 mg Oral Custom   Continuous Infusions:   Principal Problem:   Nausea & vomiting Active Problems:   Acute CHF   CKD (chronic kidney disease)   Hiccoughs    Time spent: > 35 mins    [facility]  [facility] Pager [phone]. If 7PM-7AM, please contact night-coverage at [email or web address], password [facility] 07/20/2012, 12:01 PM  LOS: 8 days

## 2012-07-20 NOTE — Progress Notes (Signed)
Family now agreeable to [facility]. [facility] has a private room available and can take patient tomorrow.  Irasema Chalk C. Eloyse Causey MSW, LCSW [phone]

## 2012-07-21 LAB — BASIC METABOLIC PANEL
BUN: 21 mg/dL (ref 6–23)
CO2: 24 mEq/L (ref 19–32)
Calcium: 8.7 mg/dL (ref 8.4–10.5)
Chloride: 99 mEq/L (ref 96–112)
Creatinine, Ser: 1.14 mg/dL (ref 0.50–1.35)

## 2012-07-21 NOTE — Consult Note (Signed)
Reason for Consult: Singultus Referring Physician: [facility]  Almyra Free HPI: This is an 79 year old male admitted for singultus, nausea, and vomiting.  He started to have symptoms that started two weeks before his admission.  It was usually after PO intake, but his symptoms continued to progress.  He was ultimately admitted to the hospital and further evaluation was performed.  An MRI of the brain for unrelated reasons was performed and it was negative for any intracranial lesions.  The CXR revealed a RUL pneumonia, but no evidence of any types of masses.  A CT scan of the ABM was performed and there was no evidence of any intraabdominal abnormalities, however, his lipase was mildly elevated.  There was an identification of a mobile gallstone, but no evidence of acute cholecystitis.  Surgery was consulted and they were not able to offer any therapy for the patient.  He was tried on Thorazine and baclofen without any long term benefit.  Additionally, he has been on PPIs without any benefit.  Past Medical History  Diagnosis Date  . Coronary artery disease   . Hypertension   . Renal disorder   . High cholesterol   . Narcolepsy   . Myocardial infarction     1994  . Barrett's esophagus     Past Surgical History  Procedure Laterality Date  . Hernia repair    . Coronary angioplasty      1994. No stent.    History reviewed. No pertinent family history.  Social History:  reports that he quit smoking about 55 years ago. He has never used smokeless tobacco. He reports that he does not drink alcohol or use illicit drugs.  Allergies:  Allergies  Allergen Reactions  . Metoprolol Other (See Comments)    Stomach cramps, gas, diarrhea    Medications:  Scheduled: . amphetamine-dextroamphetamine  20 mg Oral BID AC  . Armodafinil  250 mg Oral Daily  . aspirin  81 mg Oral Daily  . bisoprolol  5 mg Oral Daily  . buPROPion  150 mg Oral Daily  . donepezil  5 mg Oral QHS  . enoxaparin  (LOVENOX) injection  40 mg Subcutaneous Q24H  . feeding supplement  1 Container Oral Q24H  . furosemide  20 mg Oral Daily  . lactose free nutrition  237 mL Oral Q24H  . levofloxacin  500 mg Oral Daily  . metoCLOPramide (REGLAN) injection  10 mg Intravenous Q8H  . multivitamin with minerals  1 tablet Oral Daily  . pantoprazole  40 mg Oral BID AC  . sertraline  50 mg Oral Daily  . sodium chloride  3 mL Intravenous Q12H  . Sodium Oxybate  4,000 mg Oral Custom   Continuous:   Results for orders placed during the hospital encounter of 07/12/12 (from the past 24 hour(s))  BASIC METABOLIC PANEL     Status: Abnormal   Collection Time    07/21/12  4:55 AM      Result Value Range   Sodium 134 (*) 135 - 145 mEq/L   Potassium 4.0  3.5 - 5.1 mEq/L   Chloride 99  96 - 112 mEq/L   CO2 24  19 - 32 mEq/L   Glucose, Bld 116 (*) 70 - 99 mg/dL   BUN 21  6 - 23 mg/dL   Creatinine, Ser 7.84  0.50 - 1.35 mg/dL   Calcium 8.7  8.4 - 69.6 mg/dL   GFR calc non Af Amer 58 (*) >90 mL/min  GFR calc Af Amer 68 (*) >90 mL/min     No results found.  ROS:  As stated above in the HPI otherwise negative.  Blood pressure 144/64, pulse 78, temperature 98.2 F (36.8 C), temperature source Oral, resp. rate 18, height 5\' 7"  (1.702 m), weight 143 lb 3.1 oz (64.952 kg), SpO2 95.00%.    PE: Gen: NAD, Alert and Oriented HEENT:  [place]/AT, EOMI Neck: Supple, no LAD Lungs: CTA Bilaterally CV: RRR without M/G/R ABM: Soft, NTND, +BS Ext: No C/C/E  Assessment/Plan: 1) Intractable singultus. 2) Nausea/Vomiting. 3) Cholelithiasis.   I think the only other diagnostic test that has not been performed is an EGD, however, it will be low yield.  In cases such as this patient therapeutic options are limited.    Plan: 1) EGD tomorrow.  Tanish Sinkler D 07/21/2012, 3:19 PM

## 2012-07-21 NOTE — Progress Notes (Signed)
[facility] PROGRESS NOTE  FAROOQ PETROVICH WUJ:[id number] DOB: [birth year] DOA: 07/12/2012 PCP: Londell Moh, MD  Assessment/Plan: #1 acute hypoxic respiratory failure Multifactorial in nature secondary to pneumonia and CHF. Patient with clinical improvement. Patient has been transitioned to oral Levaquin. Patient received 1 dose of IV Lasix on 07/15/2012. Continue home dose Lasix. Follow.  #2 nausea and vomiting with hiccups Unknown etiology. May have been secondary to acute pancreatitis. Patient with no further nausea or vomiting and tolerating oral intake. Lipase is at baseline. Abdominal ultrasound cholelithiasis however no acute cholecystitis. Patient has been seen by general surgery and no further workup is needed at this time. Patient with significant hiccups at this time. Continue scheduled reglan and thorazine prn. Patients wife adamantly requesting GI evaluation with Dr Elnoria Howard.  #3 bladder outlet obstruction Continue Foley catheter. Will need outpatient urology followup.  #4 slurred speech on 07/14/2012 Resolved. MRI negative. Patient at baseline. Follow.  #5 chronic combined systolic and diastolic CHF EF of 45% in May of 2013. Repeat 2-D echo with EF of 50% 07/13/2012. Continue home dose Lasix. Follow.  #6 chronic kidney disease stage II to III Stable.  #7 hypokalemia Repleted.  Code Status: Full Family Communication: Updated patient, wife and daughter at bedside. Disposition Plan: SNF when medically stable   Consultants: GI consultation over the phone per Dr Izola Price Surgery  GI: Dr. Elnoria Howard 07/21/2012  Procedures: Mr Brain Wo Contrast 07/14/2012  No acute infarct. No acute intracranial abnormality identified. Cerebral volume loss and mild to moderate nonspecific cerebral white matter signal changes.The examination had to be discontinued prior to completion.  Dg Chest Port 1 View 07/15/2012  Asymmetric airspace consolidation in the right upper lobe concerning for  pneumonia. Cardiomegaly with cephalization of the pulmonary vasculature and diffuse interstitial prominence suggestive of background of mild interstitial pulmonary edema, possibly related to congestive heart failure.  Dg Chest 2 View 07/12/2012  Stable COPD/emphysema and stable mild cardiomegaly. No acute cardiopulmonary disease.  US Abdomen Complete 07/13/2012  Solitary gallstone. No findings of cholecystitis. Minimal prominence of the pancreatic duct, nonspecific. Otherwise, normal appearing abdomen.  Ct Abdomen Pelvis W Contrast 07/12/2012  Cholelithiasis. Suggestion of small bowel wall thickening involving the jejunum and liquid stool in the colon. Changes likely to represent gastroenteritis or other inflammatory process. Mild fatty infiltration of the liver.   Antibiotics Vancomycin 03/22 -> 03/24  Levaquin 03/22 -> changed to PO 03/24 -->  Maxipime 03/22 --> 03/24      HPI/Subjective: Patient complaining of hiccups. Patient denies any shortness of breath. Patient denies CP. Patient's wife adamant on GI consultation for further evaluation.  Objective: Filed Vitals:   07/20/12 1338 07/20/12 2149 07/21/12 0604 07/21/12 0849  BP: 131/59 140/64 145/49 164/70  Pulse: 73 76 75 81  Temp: 99.3 F (37.4 C) 98.8 F (37.1 C) 98.7 F (37.1 C) 98.8 F (37.1 C)  TempSrc: Oral Oral Oral Oral  Resp: 18 20 18 18   Height:      Weight:   64.952 kg (143 lb 3.1 oz)   SpO2: 99% 96% 93% 97%    Intake/Output Summary (Last 24 hours) at 07/21/12 1151 Last data filed at 07/21/12 0900  Gross per 24 hour  Intake    900 ml  Output   1420 ml  Net   -520 ml   Filed Weights   07/19/12 0433 07/20/12 0501 07/21/12 0604  Weight: 66.089 kg (145 lb 11.2 oz) 64.955 kg (143 lb 3.2 oz) 64.952 kg (143 lb 3.1 oz)  Exam:   General:  NAD  Cardiovascular: RRR  Respiratory: CTAB anterior lung fields  Abdomen: Soft/NT/ND/+BS  EXT: No c/c/e  Data Reviewed: Basic Metabolic Panel:  Recent Labs Lab  07/16/12 0434 07/17/12 0446 07/18/12 0410 07/19/12 0449 07/20/12 0458 07/21/12 0455  NA 130* 132* 133*  --  135 134*  K 3.4* 3.0* 3.6  --  3.9 4.0  CL 95* 97 101  --  101 99  CO2 22 23 22   --  23 24  GLUCOSE 108* 96 107*  --  111* 116*  BUN 17 27* 28*  --  19 21  CREATININE 1.20 1.41* 1.30 1.21 1.18 1.14  CALCIUM 9.0 8.6 8.8  --  8.7 8.7   Liver Function Tests:  Recent Labs Lab 07/19/12 0449  AST 62*  ALT 48  ALKPHOS 97  BILITOT 0.4  PROT 5.6*  ALBUMIN 2.3*    Recent Labs Lab 07/19/12 0449 07/20/12 0458  LIPASE 67* 31   No results found for this basename: AMMONIA,  in the last 168 hours CBC:  Recent Labs Lab 07/16/12 0434 07/17/12 0446 07/18/12 0410 07/19/12 0449 07/20/12 0458  WBC 9.6 8.7 8.5 9.6 10.8*  HGB 13.2 12.0* 12.2* 12.1* 10.8*  HCT 39.0 33.7* 34.5* 34.7* 31.2*  MCV 94.0 93.6 93.8 95.3 94.8  PLT 199 197 271 309 318   Cardiac Enzymes: No results found for this basename: CKTOTAL, CKMB, CKMBINDEX, TROPONINI,  in the last 168 hours BNP (last 3 results)  Recent Labs  09/01/11 1500  PROBNP 2960.0*   CBG: No results found for this basename: GLUCAP,  in the last 168 hours  Recent Results (from the past 240 hour(s))  CULTURE, BLOOD (ROUTINE X 2)     Status: None   Collection Time    07/16/12  7:40 AM      Result Value Range Status   Specimen Description BLOOD LEFT ARM   Final   Special Requests BOTTLES DRAWN AEROBIC AND ANAEROBIC 5CC   Final   Culture  Setup Time 07/16/2012 14:49   Final   Culture     Final   Value:        BLOOD CULTURE RECEIVED NO GROWTH TO DATE CULTURE WILL BE HELD FOR 5 DAYS BEFORE ISSUING A FINAL NEGATIVE REPORT   Report Status PENDING   Incomplete  CULTURE, BLOOD (ROUTINE X 2)     Status: None   Collection Time    07/16/12  7:45 AM      Result Value Range Status   Specimen Description BLOOD RIGHT ARM   Final   Special Requests BOTTLES DRAWN AEROBIC AND ANAEROBIC 5CC   Final   Culture  Setup Time 07/16/2012 14:49    Final   Culture     Final   Value:        BLOOD CULTURE RECEIVED NO GROWTH TO DATE CULTURE WILL BE HELD FOR 5 DAYS BEFORE ISSUING A FINAL NEGATIVE REPORT   Report Status PENDING   Incomplete     Studies: No results found.  Scheduled Meds: . amphetamine-dextroamphetamine  20 mg Oral BID AC  . Armodafinil  250 mg Oral Daily  . aspirin  81 mg Oral Daily  . bisoprolol  5 mg Oral Daily  . buPROPion  150 mg Oral Daily  . donepezil  5 mg Oral QHS  . enoxaparin (LOVENOX) injection  40 mg Subcutaneous Q24H  . feeding supplement  1 Container Oral Q24H  . furosemide  20 mg Oral Daily  .  lactose free nutrition  237 mL Oral Q24H  . levofloxacin  500 mg Oral Daily  . metoCLOPramide (REGLAN) injection  10 mg Intravenous Q8H  . multivitamin with minerals  1 tablet Oral Daily  . pantoprazole  40 mg Oral BID AC  . sertraline  50 mg Oral Daily  . sodium chloride  3 mL Intravenous Q12H  . Sodium Oxybate  4,000 mg Oral Custom   Continuous Infusions:   Principal Problem:   Nausea & vomiting Active Problems:   Acute CHF   CKD (chronic kidney disease)   CAD (coronary artery disease)   HTN (hypertension)   Hiccoughs    Time spent: > 35 mins    [facility]  [facility] Pager [phone]. If 7PM-7AM, please contact night-coverage at [email or web address], password [facility] 07/21/2012, 11:51 AM  LOS: 9 days

## 2012-07-21 NOTE — Progress Notes (Signed)
Occupational Therapy Treatment Patient Details Name: Kirk Townsend MRN: [id number] DOB: 1929/02/15 Today's Date: 07/21/2012 Time: 4098-1191 OT Time Calculation (min): 26 min  OT Assessment / Plan / Recommendation Comments on Treatment Session Pt doing better, overall min assist to min guard assist for mobility and selfcare tasks.  O2 sats still decreasing into the upper 90s on 2Ls nasal cannula with activity.  Remain at 94% at rest on 2Ls.    Follow Up Recommendations  SNF    Barriers to Discharge       Equipment Recommendations  None recommended by OT       Frequency Min 2X/week   Plan Discharge plan remains appropriate    Precautions / Restrictions Precautions Precautions: Fall Restrictions Weight Bearing Restrictions: No   Pertinent Vitals/Pain O2 sats 94% on 2Ls decreasing to 88% with activity.  Needed one minute of rest and purse lip breathing to increase back to 92 %    ADL  Toilet Transfer: Simulated;Minimal assistance Toilet Transfer Method: Other (comment) (ambulating with RW to bedside chair.  Pt declined need to go) Acupuncturist: Other (comment) (RW and bedside chair) Toileting - Clothing Manipulation and Hygiene: Simulated;Minimal assistance Where Assessed - Toileting Clothing Manipulation and Hygiene: Sit to stand from 3-in-1 or toilet Transfers/Ambulation Related to ADLs: Pt overall min guard assisst with mobility using the RW. ADL Comments: Pt still with limited endurance overall.  On 2Ls nasal cannula sats decreased to 88-89% with mobility in the hallway.  Educated pt on purse lip breathing techniques and sats increased back to 92% after 1 min of rest.        OT Goals Acute Rehab OT Goals Time For Goal Achievement: 07/28/12 ADL Goals Pt Will Perform Grooming: with supervision;Standing at sink ADL Goal: Grooming - Progress: Revised due to lack of progress Pt Will Perform Lower Body Dressing: with supervision;Sit to stand from chair ADL Goal:  Lower Body Dressing - Progress: Revised due to lack of progress Pt Will Transfer to Toilet: with supervision;Comfort height toilet ADL Goal: Toilet Transfer - Progress: Revised due to lack of progress Pt Will Perform Toileting - Clothing Manipulation: with supervision;Standing ADL Goal: Toileting - Clothing Manipulation - Progress: Revised due to lack of progress Pt Will Perform Toileting - Hygiene: with supervision;Sit to stand from 3-in-1/toilet ADL Goal: Toileting - Hygiene - Progress: Revised due to lack of progress  Visit Information  Last OT Received On: 07/21/12 Assistance Needed: +1    Subjective Data  Subjective: I wish I could get outside. Patient Stated Goal: Patient wants to get back to being as independent as possible.      Cognition  Cognition Overall Cognitive Status: Appears within functional limits for tasks assessed/performed Arousal/Alertness: Awake/alert Orientation Level: Appears intact for tasks assessed Behavior During Session: [facility] for tasks performed    Mobility  Bed Mobility Bed Mobility: Supine to Sit Supine to Sit: 4: Min guard;HOB elevated;With rails Transfers Transfers: Sit to Stand Sit to Stand: 4: Min assist;From bed;With upper extremity assist Stand to Sit: 4: Min assist;With upper extremity assist;To chair/3-in-1       Balance Balance Balance Assessed: Yes Dynamic Sitting Balance Dynamic Sitting - Balance Support: No upper extremity supported Dynamic Sitting - Level of Assistance: 4: Min assist High Level Balance High Level Balance Comments: Pt with occasional LOB posteriorly and to the side with sit to stand and dynamic standing.   End of Session OT - End of Session Equipment Utilized During Treatment: Gait belt Activity Tolerance: Patient limited  by fatigue Patient left: in chair;with call bell/phone within reach;with family/visitor present Nurse Communication: Mobility status     Shamar Engelmann OTR/L Pager number  [phone] 07/21/2012, 2:07 PM

## 2012-07-22 ENCOUNTER — Encounter (HOSPITAL_COMMUNITY)
Admission: EM | Disposition: A | Discharge status: Skilled Nursing Facility | Payer: Self-pay | Source: Home / Self Care | Attending: Internal Medicine

## 2012-07-22 HISTORY — PX: ESOPHAGOGASTRODUODENOSCOPY: SHX5428

## 2012-07-22 LAB — BASIC METABOLIC PANEL
CO2: 25 mEq/L (ref 19–32)
Calcium: 8.7 mg/dL (ref 8.4–10.5)
Creatinine, Ser: 1.22 mg/dL (ref 0.50–1.35)
GFR calc non Af Amer: 54 mL/min — ABNORMAL LOW (ref >90)
Glucose, Bld: 111 mg/dL — ABNORMAL HIGH (ref 70–99)
Sodium: 134 mEq/L — ABNORMAL LOW (ref 135–145)

## 2012-07-22 LAB — CULTURE, BLOOD (ROUTINE X 2)

## 2012-07-22 SURGERY — EGD (ESOPHAGOGASTRODUODENOSCOPY)
Anesthesia: Moderate Sedation

## 2012-07-22 MED ORDER — BUTAMBEN-TETRACAINE-BENZOCAINE 2-2-14 % EX AERO
INHALATION_SPRAY | CUTANEOUS | Status: DC | PRN
  Administered 2012-07-22: 1 via TOPICAL

## 2012-07-22 MED ORDER — FENTANYL CITRATE 0.05 MG/ML IJ SOLN
INTRAMUSCULAR | Status: DC | PRN
  Administered 2012-07-22: 25 ug via INTRAVENOUS

## 2012-07-22 MED ORDER — BOOST / RESOURCE BREEZE PO LIQD: 1.0000 | ORAL | Status: AC

## 2012-07-22 MED ORDER — FLUCONAZOLE 100 MG PO TABS: 100.0000 mg | ORAL_TABLET | Freq: Every day | ORAL | Status: DC

## 2012-07-22 MED ORDER — SODIUM CHLORIDE 0.9 % IV SOLN
INTRAVENOUS | Status: DC
  Administered 2012-07-22: 500 mL via INTRAVENOUS

## 2012-07-22 MED ORDER — DIPHENHYDRAMINE HCL 50 MG/ML IJ SOLN
INTRAMUSCULAR | Status: AC
  Filled 2012-07-22: qty 1

## 2012-07-22 MED ORDER — MIDAZOLAM HCL 10 MG/2ML IJ SOLN
INTRAMUSCULAR | Status: AC
  Filled 2012-07-22: qty 2

## 2012-07-22 MED ORDER — FENTANYL CITRATE 0.05 MG/ML IJ SOLN
INTRAMUSCULAR | Status: AC
  Filled 2012-07-22: qty 2

## 2012-07-22 MED ORDER — BOOST PLUS PO LIQD: 237.0000 mL | ORAL | Status: AC

## 2012-07-22 MED ORDER — PROMETHAZINE HCL 25 MG PO TABS: 25.0000 mg | ORAL_TABLET | Freq: Four times a day (QID) | ORAL | Status: AC | PRN

## 2012-07-22 MED ORDER — MIDAZOLAM HCL 10 MG/2ML IJ SOLN
INTRAMUSCULAR | Status: DC | PRN
  Administered 2012-07-22: 1 mg via INTRAVENOUS

## 2012-07-22 MED ORDER — PANTOPRAZOLE SODIUM 40 MG PO TBEC: 40.0000 mg | DELAYED_RELEASE_TABLET | Freq: Two times a day (BID) | ORAL | Status: DC

## 2012-07-22 MED ORDER — FLUCONAZOLE 100 MG PO TABS
100.0000 mg | ORAL_TABLET | Freq: Every day | ORAL | Status: DC
  Administered 2012-07-22 – 2012-07-25 (×4): 100 mg via ORAL
  Filled 2012-07-22 (×4): qty 1

## 2012-07-22 MED ORDER — FLUCONAZOLE 100 MG PO TABS: 100.0000 mg | ORAL_TABLET | Freq: Once | ORAL | Status: DC

## 2012-07-22 MED ORDER — CHLORPROMAZINE HCL 50 MG PO TABS: 50.0000 mg | ORAL_TABLET | Freq: Four times a day (QID) | ORAL | Status: DC | PRN

## 2012-07-22 NOTE — Progress Notes (Signed)
PT Cancellation Note  Patient Details Name: LAKE CINQUEMANI MRN: [id number] DOB: 07/29/1928   Cancelled Treatment:    Reason Eval/Treat Not Completed: Patient at procedure or test/unavailable   Mikal Wisman,KATHrine E 07/22/2012, 2:33 PM Pager: [phone]

## 2012-07-22 NOTE — Progress Notes (Signed)
Patient back from EGD, alert and oriented, denies any pain or distress, in bed resting but still sleepy, follows commands. Vitals 130/67, 98.1 130/67, 65 16, 93%-2L. Will continue to assess patient.

## 2012-07-22 NOTE — Op Note (Signed)
[facility] [address] [place], [address]   OPERATIVE PROCEDURE REPORT  PATIENT: Kirk Townsend, Kirk Townsend  MR#: [id number] BIRTHDATE: Nov 24, 1928  GENDER: Male ENDOSCOPIST: Jeani Hawking, MD ASSISTANT:   Kandice Robinsons, technician and Claudie Revering, RN CGRN PROCEDURE DATE: 07/22/2012 PROCEDURE:   EGD w/ biopsy ASA CLASS:   Class III INDICATIONS:Singultus. MEDICATIONS: Versed 2 mg IV and Fentanyl 25 mcg IV TOPICAL ANESTHETIC:   none  DESCRIPTION OF PROCEDURE:   After the risks benefits and alternatives of the procedure were thoroughly explained, informed consent was obtained.  The     endoscope was introduced through the mouth  and advanced to the second portion of the duodenum Without limitations.      The instrument was slowly withdrawn as the mucosa was fully examined.      FINDINGS: In the distal esophagus a nonobstructive Schatzkii's ring was identified in the setting of a 2 cm hiatal hernia.  A very mild Candidal esophagitis was noted.  In the proximal gastric body a gastritis was noted and cold biopsies were obtained.  No other abnormalities noted.   Retroflexed views revealed no abnormalities. The scope was then withdrawn from the patient and the procedure terminated.  COMPLICATIONS: There were no complications. IMPRESSION: 1) Mild distal Candidal esophagitis. 2) Nonobstructive Schatzki's ring. 3) Proximal gastritis.  RECOMMENDATIONS: 1) Fluconazole 100 mg QD x 10 days. 2) Await biopsy results.   _______________________________ eSignedJeani Hawking, MD 07/22/2012 1:50 PM

## 2012-07-22 NOTE — H&P (View-Only) (Signed)
Reason for Consult: Singultus Referring Physician: [facility]  Kirk Townsend HPI: This is an 79 year old male admitted for singultus, nausea, and vomiting.  He started to have symptoms that started two weeks before his admission.  It was usually after PO intake, but his symptoms continued to progress.  He was ultimately admitted to the hospital and further evaluation was performed.  An MRI of the brain for unrelated reasons was performed and it was negative for any intracranial lesions.  The CXR revealed a RUL pneumonia, but no evidence of any types of masses.  A CT scan of the ABM was performed and there was no evidence of any intraabdominal abnormalities, however, his lipase was mildly elevated.  There was an identification of a mobile gallstone, but no evidence of acute cholecystitis.  Surgery was consulted and they were not able to offer any therapy for the patient.  He was tried on Thorazine and baclofen without any long term benefit.  Additionally, he has been on PPIs without any benefit.  Past Medical History  Diagnosis Date  . Coronary artery disease   . Hypertension   . Renal disorder   . High cholesterol   . Narcolepsy   . Myocardial infarction     1994  . Barrett's esophagus     Past Surgical History  Procedure Laterality Date  . Hernia repair    . Coronary angioplasty      1994. No stent.    History reviewed. No pertinent family history.  Social History:  reports that he quit smoking about 55 years ago. He has never used smokeless tobacco. He reports that he does not drink alcohol or use illicit drugs.  Allergies:  Allergies  Allergen Reactions  . Metoprolol Other (See Comments)    Stomach cramps, gas, diarrhea    Medications:  Scheduled: . amphetamine-dextroamphetamine  20 mg Oral BID AC  . Armodafinil  250 mg Oral Daily  . aspirin  81 mg Oral Daily  . bisoprolol  5 mg Oral Daily  . buPROPion  150 mg Oral Daily  . donepezil  5 mg Oral QHS  . enoxaparin  (LOVENOX) injection  40 mg Subcutaneous Q24H  . feeding supplement  1 Container Oral Q24H  . furosemide  20 mg Oral Daily  . lactose free nutrition  237 mL Oral Q24H  . levofloxacin  500 mg Oral Daily  . metoCLOPramide (REGLAN) injection  10 mg Intravenous Q8H  . multivitamin with minerals  1 tablet Oral Daily  . pantoprazole  40 mg Oral BID AC  . sertraline  50 mg Oral Daily  . sodium chloride  3 mL Intravenous Q12H  . Sodium Oxybate  4,000 mg Oral Custom   Continuous:   Results for orders placed during the hospital encounter of 07/12/12 (from the past 24 hour(s))  BASIC METABOLIC PANEL     Status: Abnormal   Collection Time    07/21/12  4:55 AM      Result Value Range   Sodium 134 (*) 135 - 145 mEq/L   Potassium 4.0  3.5 - 5.1 mEq/L   Chloride 99  96 - 112 mEq/L   CO2 24  19 - 32 mEq/L   Glucose, Bld 116 (*) 70 - 99 mg/dL   BUN 21  6 - 23 mg/dL   Creatinine, Ser 1.14  0.50 - 1.35 mg/dL   Calcium 8.7  8.4 - 10.5 mg/dL   GFR calc non Af Amer 58 (*) >90 mL/min     GFR calc Af Amer 68 (*) >90 mL/min     No results found.  ROS:  As stated above in the HPI otherwise negative.  Blood pressure 144/64, pulse 78, temperature 98.2 F (36.8 C), temperature source Oral, resp. rate 18, height 5' 7" (1.702 m), weight 143 lb 3.1 oz (64.952 kg), SpO2 95.00%.    PE: Gen: NAD, Alert and Oriented HEENT:  [place]/AT, EOMI Neck: Supple, no LAD Lungs: CTA Bilaterally CV: RRR without M/G/R ABM: Soft, NTND, +BS Ext: No C/C/E  Assessment/Plan: 1) Intractable singultus. 2) Nausea/Vomiting. 3) Cholelithiasis.   I think the only other diagnostic test that has not been performed is an EGD, however, it will be low yield.  In cases such as this patient therapeutic options are limited.    Plan: 1) EGD tomorrow.  Kirk Townsend D 07/21/2012, 3:19 PM      

## 2012-07-22 NOTE — Interval H&P Note (Signed)
History and Physical Interval Note:  07/22/2012 1:12 PM  Kirk Townsend  has presented today for surgery, with the diagnosis of Hiccups  The various methods of treatment have been discussed with the patient and family. After consideration of risks, benefits and other options for treatment, the patient has consented to  Procedure(s): ESOPHAGOGASTRODUODENOSCOPY (EGD) (N/A) as a surgical intervention .  The patient's history has been reviewed, patient examined, no change in status, stable for surgery.  I have reviewed the patient's chart and labs.  Questions were answered to the patient's satisfaction.     Siraj Dermody D

## 2012-07-22 NOTE — Progress Notes (Signed)
[facility] PROGRESS NOTE  Kirk Townsend:[id number] DOB: Jul 31, 1928 DOA: 07/12/2012 PCP: Londell Moh, MD  Assessment/Plan: #1 acute hypoxic respiratory failure Multifactorial in nature secondary to pneumonia and CHF. Patient with clinical improvement. Patient has been transitioned to oral Levaquin. Patient received 1 dose of IV Lasix on 07/15/2012. Continue home dose Lasix. Follow.  #2 nausea and vomiting with hiccups Unknown etiology. May have been secondary to Candida esophagitis noted on EGD done today versus acute pancreatitis. Patient with no further nausea or vomiting and tolerating oral intake. Lipase is at baseline. Abdominal ultrasound cholelithiasis however no acute cholecystitis. Patient has been seen by general surgery and no further workup is needed at this time. Patient with significant hiccups at this time. Continue scheduled reglan and thorazine prn. Patient was seen by GI yesterday Dr. Elnoria Howard, patient underwent an upper anoscopy today which showed some Candida esophagitis and gastritis. Biopsies are pending. Patient has been started on Diflucan x10 days. GI following and appreciate input and recommendations.  #3 Candida esophagitis The EGD on 07/22/2012. Diflucan x10 days. Biopsies pending. GI following and appreciate input and recommendations.  #4 bladder outlet obstruction Continue Foley catheter. Will need outpatient urology followup.  #5 slurred speech on 07/14/2012 Resolved. MRI negative. Patient at baseline. Follow.  #6 chronic combined systolic and diastolic CHF EF of 45% in May of 2013. Repeat 2-D echo with EF of 50% 07/13/2012. Continue home dose Lasix. Follow.  #7 chronic kidney disease stage II to III Stable.  #8 hypokalemia Repleted.  Code Status: Full Family Communication: Updated patient, wife at bedside. Disposition Plan: SNF when medically stable   Consultants: GI consultation over the phone per Dr Izola Price Surgery  GI: Dr. Elnoria Howard  07/21/2012  Procedures: Mr Brain Wo Contrast 07/14/2012  No acute infarct. No acute intracranial abnormality identified. Cerebral volume loss and mild to moderate nonspecific cerebral white matter signal changes.The examination had to be discontinued prior to completion.  Dg Chest Port 1 View 07/15/2012  Asymmetric airspace consolidation in the right upper lobe concerning for pneumonia. Cardiomegaly with cephalization of the pulmonary vasculature and diffuse interstitial prominence suggestive of background of mild interstitial pulmonary edema, possibly related to congestive heart failure.  Dg Chest 2 View 07/12/2012  Stable COPD/emphysema and stable mild cardiomegaly. No acute cardiopulmonary disease.  US Abdomen Complete 07/13/2012  Solitary gallstone. No findings of cholecystitis. Minimal prominence of the pancreatic duct, nonspecific. Otherwise, normal appearing abdomen.  Ct Abdomen Pelvis W Contrast 07/12/2012  Cholelithiasis. Suggestion of small bowel wall thickening involving the jejunum and liquid stool in the colon. Changes likely to represent gastroenteritis or other inflammatory process. Mild fatty infiltration of the liver.  EGD 07/22/2012  Antibiotics Vancomycin 03/22 -> 03/24  Levaquin 03/22 -> changed to PO 03/24 -->  Maxipime 03/22 --> 03/24      HPI/Subjective: Patient complaining of hiccups. Patient denies any shortness of breath. Patient denies CP.   Objective: Filed Vitals:   07/22/12 1400 07/22/12 1410 07/22/12 1420 07/22/12 1436  BP: 102/58 112/58 115/55 130/67  Pulse:    65  Temp:    98.1 F (36.7 C)  TempSrc:    Oral  Resp: 15 16 18 16   Height:      Weight:      SpO2: 92% 92% 92% 93%    Intake/Output Summary (Last 24 hours) at 07/22/12 1637 Last data filed at 07/22/12 0513  Gross per 24 hour  Intake      3 ml  Output   1125 ml  Net  -1122 ml   Filed Weights   07/20/12 0501 07/21/12 0604 07/22/12 0435  Weight: 64.955 kg (143 lb 3.2 oz) 64.952 kg (143  lb 3.1 oz) 66.2 kg (145 lb 15.1 oz)    Exam:   General:  NAD  Cardiovascular: RRR  Respiratory: CTAB anterior lung fields  Abdomen: Soft/NT/ND/+BS  EXT: No c/c/e  Data Reviewed: Basic Metabolic Panel:  Recent Labs Lab 07/17/12 0446 07/18/12 0410 07/19/12 0449 07/20/12 0458 07/21/12 0455 07/22/12 0443  NA 132* 133*  --  135 134* 134*  K 3.0* 3.6  --  3.9 4.0 3.9  CL 97 101  --  101 99 98  CO2 23 22  --  23 24 25   GLUCOSE 96 107*  --  111* 116* 111*  BUN 27* 28*  --  19 21 22   CREATININE 1.41* 1.30 1.21 1.18 1.14 1.22  CALCIUM 8.6 8.8  --  8.7 8.7 8.7   Liver Function Tests:  Recent Labs Lab 07/19/12 0449  AST 62*  ALT 48  ALKPHOS 97  BILITOT 0.4  PROT 5.6*  ALBUMIN 2.3*    Recent Labs Lab 07/19/12 0449 07/20/12 0458  LIPASE 67* 31   No results found for this basename: AMMONIA,  in the last 168 hours CBC:  Recent Labs Lab 07/16/12 0434 07/17/12 0446 07/18/12 0410 07/19/12 0449 07/20/12 0458  WBC 9.6 8.7 8.5 9.6 10.8*  HGB 13.2 12.0* 12.2* 12.1* 10.8*  HCT 39.0 33.7* 34.5* 34.7* 31.2*  MCV 94.0 93.6 93.8 95.3 94.8  PLT 199 197 271 309 318   Cardiac Enzymes: No results found for this basename: CKTOTAL, CKMB, CKMBINDEX, TROPONINI,  in the last 168 hours BNP (last 3 results)  Recent Labs  09/01/11 1500  PROBNP 2960.0*   CBG: No results found for this basename: GLUCAP,  in the last 168 hours  Recent Results (from the past 240 hour(s))  CULTURE, BLOOD (ROUTINE X 2)     Status: None   Collection Time    07/16/12  7:40 AM      Result Value Range Status   Specimen Description BLOOD LEFT ARM   Final   Special Requests BOTTLES DRAWN AEROBIC AND ANAEROBIC 5CC   Final   Culture  Setup Time 07/16/2012 14:49   Final   Culture NO GROWTH 5 DAYS   Final   Report Status 07/22/2012 FINAL   Final  CULTURE, BLOOD (ROUTINE X 2)     Status: None   Collection Time    07/16/12  7:45 AM      Result Value Range Status   Specimen Description BLOOD  RIGHT ARM   Final   Special Requests BOTTLES DRAWN AEROBIC AND ANAEROBIC 5CC   Final   Culture  Setup Time 07/16/2012 14:49   Final   Culture NO GROWTH 5 DAYS   Final   Report Status 07/22/2012 FINAL   Final     Studies: No results found.  Scheduled Meds: . amphetamine-dextroamphetamine  20 mg Oral BID AC  . Armodafinil  250 mg Oral Daily  . aspirin  81 mg Oral Daily  . bisoprolol  5 mg Oral Daily  . buPROPion  150 mg Oral Daily  . donepezil  5 mg Oral QHS  . enoxaparin (LOVENOX) injection  40 mg Subcutaneous Q24H  . feeding supplement  1 Container Oral Q24H  . fluconazole  100 mg Oral Daily  . furosemide  20 mg Oral Daily  . lactose free nutrition  237 mL  Oral Q24H  . levofloxacin  500 mg Oral Daily  . multivitamin with minerals  1 tablet Oral Daily  . pantoprazole  40 mg Oral BID AC  . sertraline  50 mg Oral Daily  . sodium chloride  3 mL Intravenous Q12H  . Sodium Oxybate  4,000 mg Oral Custom   Continuous Infusions:   Principal Problem:   Nausea & vomiting Active Problems:   Acute CHF   CKD (chronic kidney disease)   CAD (coronary artery disease)   HTN (hypertension)   Hiccoughs   Acute esophagitis    Time spent: > 35 mins    [facility]  [facility] Pager [phone]. If 7PM-7AM, please contact night-coverage at [email or web address], password [facility] 07/22/2012, 4:37 PM  LOS: 10 days

## 2012-07-23 DIAGNOSIS — R066 Hiccough: Secondary | ICD-10-CM

## 2012-07-23 LAB — BASIC METABOLIC PANEL
BUN: 22 mg/dL (ref 6–23)
Calcium: 8.9 mg/dL (ref 8.4–10.5)
Creatinine, Ser: 1.2 mg/dL (ref 0.50–1.35)
GFR calc Af Amer: 64 mL/min — ABNORMAL LOW (ref >90)
GFR calc non Af Amer: 55 mL/min — ABNORMAL LOW (ref >90)
Glucose, Bld: 107 mg/dL — ABNORMAL HIGH (ref 70–99)

## 2012-07-23 NOTE — Progress Notes (Signed)
Per MD, Pt ready for d/c.  Contacted [facility].  Per April, supervisor, at [facility], admission wasn't pre-arranged, so facility cannot admit over the weekend.  She confirmed this with Revonda Standard, [facility] CSW.  Notified MD and Pt's family.  Weekday CSW to follow.  Providence Crosby, LCSWA Clinical Social Work [phone]

## 2012-07-23 NOTE — Progress Notes (Signed)
O2 at rest on room air 92% O2 on room air during/post  ambulation 87% O2 on 2L post ambulation 94%

## 2012-07-23 NOTE — Progress Notes (Signed)
  [facility] Progress Note  Subjective: *Pt is sleeping.  According to spouse he was not hiccuping during the night.**  Objective:  Vital signs in last 24 hours: Temp:  [98.1 F (36.7 C)-98.8 F (37.1 C)] 98.8 F (37.1 C) (03/29 0543) Pulse Rate:  [65-83] 83 (03/29 0543) Resp:  [15-18] 15 (03/29 0543) BP: (102-174)/(55-80) 174/73 mmHg (03/29 0543) SpO2:  [91 %-96 %] 94 % (03/29 0543) Weight:  [143 lb (64.864 kg)] 143 lb (64.864 kg) (03/29 0543) Last BM Date: 07/22/12 General:   Alert,  Well-developed,  white male in NAD  Intake/Output from previous day: 03/28 0701 - 03/29 0700 In: -  Out: 1450 [Urine:1450] Intake/Output this shift:    Lab Results: No results found for this basename: WBC, HGB, HCT, PLT,  in the last 72 hours BMET  Recent Labs  07/21/12 0455 07/22/12 0443 07/23/12 0517  NA 134* 134* 131*  K 4.0 3.9 4.4  CL 99 98 95*  CO2 24 25 23   GLUCOSE 116* 111* 107*  BUN 21 22 22   CREATININE 1.14 1.22 1.20  CALCIUM 8.7 8.7 8.9   LFT No results found for this basename: PROT, ALBUMIN, AST, ALT, ALKPHOS, BILITOT, BILIDIR, IBILI,  in the last 72 hours PT/INR No results found for this basename: LABPROT, INR,  in the last 72 hours Hepatitis Panel No results found for this basename: HEPBSAG, HCVAB, HEPAIGM, HEPBIGM,  in the last 72 hours  Studies/Results: No results found.   Assessment / Plan: *Hiccups - appears to be improving.  ? Related to candida esophagitis.  Plan to continue current meds. ** Principal Problem:   Nausea & vomiting Active Problems:   Acute CHF   CKD (chronic kidney disease)   CAD (coronary artery disease)   HTN (hypertension)   Hiccoughs   Acute esophagitis     LOS: 11 days   Melvia Heaps  07/23/2012, 9:09 AM

## 2012-07-23 NOTE — Progress Notes (Signed)
[facility] PROGRESS NOTE  Kirk Townsend:[id number] DOB: 1928/10/24 DOA: 07/12/2012 PCP: Londell Moh, MD  Assessment/Plan: #1 acute hypoxic respiratory failure Multifactorial in nature secondary to pneumonia and CHF. Patient with clinical improvement. Patient has been transitioned to oral Levaquin. Patient received 1 dose of IV Lasix on 07/15/2012. Continue home dose Lasix. Follow.  #2 nausea and vomiting with hiccups Unknown etiology. May have been secondary to Candida esophagitis noted on EGD. Patient with no further nausea or vomiting and tolerating oral intake. Lipase is at baseline. Abdominal ultrasound cholelithiasis however no acute cholecystitis. Patient has been seen by general surgery and no further workup is needed at this time. Patient with significant hiccups at this time. Continue scheduled reglan and thorazine prn. Patient was seen by GI, Dr. Elnoria Howard, patient underwent an upper endoscopy today which showed some Candida esophagitis and gastritis. Biopsies are pending. Patient has been started on Diflucan x10 days. GI following and appreciate input and recommendations.  #3 Candida esophagitis The EGD on 07/22/2012. Diflucan x10 days. Biopsies pending. GI following and appreciate input and recommendations.  #4 bladder outlet obstruction Continue Foley catheter. Will need outpatient urology followup.  #5 slurred speech on 07/14/2012 Resolved. MRI negative. Patient at baseline. Follow.  #6 chronic combined systolic and diastolic CHF EF of 45% in May of 2013. Repeat 2-D echo with EF of 50% 07/13/2012. Continue home dose Lasix. Follow.  #7 chronic kidney disease stage II to III Stable.  #8 hypokalemia Repleted.  Code Status: Full Family Communication: Updated patient, wife at bedside. Disposition Plan: SNF when medically stable   Consultants: GI consultation over the phone per Dr Izola Price Surgery  GI: Dr. Elnoria Howard 07/21/2012  Procedures: Mr Brain Wo Contrast  07/14/2012  No acute infarct. No acute intracranial abnormality identified. Cerebral volume loss and mild to moderate nonspecific cerebral white matter signal changes.The examination had to be discontinued prior to completion.  Dg Chest Port 1 View 07/15/2012  Asymmetric airspace consolidation in the right upper lobe concerning for pneumonia. Cardiomegaly with cephalization of the pulmonary vasculature and diffuse interstitial prominence suggestive of background of mild interstitial pulmonary edema, possibly related to congestive heart failure.  Dg Chest 2 View 07/12/2012  Stable COPD/emphysema and stable mild cardiomegaly. No acute cardiopulmonary disease.  US Abdomen Complete 07/13/2012  Solitary gallstone. No findings of cholecystitis. Minimal prominence of the pancreatic duct, nonspecific. Otherwise, normal appearing abdomen.  Ct Abdomen Pelvis W Contrast 07/12/2012  Cholelithiasis. Suggestion of small bowel wall thickening involving the jejunum and liquid stool in the colon. Changes likely to represent gastroenteritis or other inflammatory process. Mild fatty infiltration of the liver.  EGD 07/22/2012  Antibiotics Vancomycin 03/22 -> 03/24  Levaquin 03/22 -> changed to PO 03/24 --> 07/23/12 Maxipime 03/22 --> 03/24      HPI/Subjective: Patient states some improvement with hiccups after EGD and starting fluconazole. Patient denies any shortness of breath. Patient denies CP.   Objective: Filed Vitals:   07/23/12 0010 07/23/12 0014 07/23/12 0046 07/23/12 0543  BP: 169/70 154/71 130/80 174/73  Pulse: 70   83  Temp: 98.6 F (37 C)   98.8 F (37.1 C)  TempSrc: Oral   Oral  Resp: 16   15  Height:      Weight:    64.864 kg (143 lb)  SpO2: 96%   94%    Intake/Output Summary (Last 24 hours) at 07/23/12 1130 Last data filed at 07/23/12 0550  Gross per 24 hour  Intake      0 ml  Output   1050 ml  Net  -1050 ml   Filed Weights   07/21/12 0604 07/22/12 0435 07/23/12 0543  Weight:  64.952 kg (143 lb 3.1 oz) 66.2 kg (145 lb 15.1 oz) 64.864 kg (143 lb)    Exam:   General:  NAD  Cardiovascular: RRR  Respiratory: CTAB anterior lung fields  Abdomen: Soft/NT/ND/+BS  EXT: No c/c/e  Data Reviewed: Basic Metabolic Panel:  Recent Labs Lab 07/18/12 0410 07/19/12 0449 07/20/12 0458 07/21/12 0455 07/22/12 0443 07/23/12 0517  NA 133*  --  135 134* 134* 131*  K 3.6  --  3.9 4.0 3.9 4.4  CL 101  --  101 99 98 95*  CO2 22  --  23 24 25 23   GLUCOSE 107*  --  111* 116* 111* 107*  BUN 28*  --  19 21 22 22   CREATININE 1.30 1.21 1.18 1.14 1.22 1.20  CALCIUM 8.8  --  8.7 8.7 8.7 8.9   Liver Function Tests:  Recent Labs Lab 07/19/12 0449  AST 62*  ALT 48  ALKPHOS 97  BILITOT 0.4  PROT 5.6*  ALBUMIN 2.3*    Recent Labs Lab 07/19/12 0449 07/20/12 0458  LIPASE 67* 31   No results found for this basename: AMMONIA,  in the last 168 hours CBC:  Recent Labs Lab 07/17/12 0446 07/18/12 0410 07/19/12 0449 07/20/12 0458  WBC 8.7 8.5 9.6 10.8*  HGB 12.0* 12.2* 12.1* 10.8*  HCT 33.7* 34.5* 34.7* 31.2*  MCV 93.6 93.8 95.3 94.8  PLT 197 271 309 318   Cardiac Enzymes: No results found for this basename: CKTOTAL, CKMB, CKMBINDEX, TROPONINI,  in the last 168 hours BNP (last 3 results)  Recent Labs  09/01/11 1500  PROBNP 2960.0*   CBG: No results found for this basename: GLUCAP,  in the last 168 hours  Recent Results (from the past 240 hour(s))  CULTURE, BLOOD (ROUTINE X 2)     Status: None   Collection Time    07/16/12  7:40 AM      Result Value Range Status   Specimen Description BLOOD LEFT ARM   Final   Special Requests BOTTLES DRAWN AEROBIC AND ANAEROBIC 5CC   Final   Culture  Setup Time 07/16/2012 14:49   Final   Culture NO GROWTH 5 DAYS   Final   Report Status 07/22/2012 FINAL   Final  CULTURE, BLOOD (ROUTINE X 2)     Status: None   Collection Time    07/16/12  7:45 AM      Result Value Range Status   Specimen Description BLOOD RIGHT  ARM   Final   Special Requests BOTTLES DRAWN AEROBIC AND ANAEROBIC 5CC   Final   Culture  Setup Time 07/16/2012 14:49   Final   Culture NO GROWTH 5 DAYS   Final   Report Status 07/22/2012 FINAL   Final     Studies: No results found.  Scheduled Meds: . amphetamine-dextroamphetamine  20 mg Oral BID AC  . Armodafinil  250 mg Oral Daily  . aspirin  81 mg Oral Daily  . bisoprolol  5 mg Oral Daily  . buPROPion  150 mg Oral Daily  . donepezil  5 mg Oral QHS  . enoxaparin (LOVENOX) injection  40 mg Subcutaneous Q24H  . feeding supplement  1 Container Oral Q24H  . fluconazole  100 mg Oral Daily  . lactose free nutrition  237 mL Oral Q24H  . levofloxacin  500 mg Oral Daily  .  multivitamin with minerals  1 tablet Oral Daily  . pantoprazole  40 mg Oral BID AC  . sertraline  50 mg Oral Daily  . sodium chloride  3 mL Intravenous Q12H  . Sodium Oxybate  4,000 mg Oral Custom   Continuous Infusions:   Principal Problem:   Nausea & vomiting Active Problems:   Acute CHF   CKD (chronic kidney disease)   CAD (coronary artery disease)   HTN (hypertension)   Hiccoughs   Acute esophagitis    Time spent: > 35 mins    [facility]  [facility] Pager [phone]. If 7PM-7AM, please contact night-coverage at [email or web address], password [facility] 07/23/2012, 11:30 AM  LOS: 11 days

## 2012-07-24 MED ORDER — ALUM & MAG HYDROXIDE-SIMETH 200-200-20 MG/5ML PO SUSP: 15.0000 mL | ORAL | Status: DC | PRN

## 2012-07-24 MED ORDER — MAGIC MOUTHWASH W/LIDOCAINE
15.0000 mL | Freq: Four times a day (QID) | ORAL | Status: DC | PRN
  Administered 2012-07-24: 15 mL via ORAL
  Filled 2012-07-24: qty 15

## 2012-07-24 MED ORDER — METOCLOPRAMIDE HCL 5 MG/ML IJ SOLN
10.0000 mg | Freq: Once | INTRAMUSCULAR | Status: AC
  Administered 2012-07-24: 10 mg via INTRAVENOUS
  Filled 2012-07-24: qty 2

## 2012-07-24 MED ORDER — BISOPROLOL FUMARATE 10 MG PO TABS
10.0000 mg | ORAL_TABLET | Freq: Every day | ORAL | Status: DC
  Administered 2012-07-25: 10 mg via ORAL
  Filled 2012-07-24: qty 1

## 2012-07-24 MED ORDER — BISOPROLOL FUMARATE 5 MG PO TABS
5.0000 mg | ORAL_TABLET | Freq: Once | ORAL | Status: AC
  Administered 2012-07-24: 5 mg via ORAL
  Filled 2012-07-24: qty 1

## 2012-07-24 MED ORDER — SUCRALFATE 1 GM/10ML PO SUSP
1.0000 g | Freq: Four times a day (QID) | ORAL | Status: DC
  Administered 2012-07-24 – 2012-07-25 (×4): 1 g via ORAL
  Filled 2012-07-24 (×8): qty 10

## 2012-07-24 MED FILL — Diphenhydramine HCl Inj 50 MG/ML: INTRAMUSCULAR | Qty: 1 | Status: AC

## 2012-07-24 NOTE — Progress Notes (Signed)
  [facility] Progress Note  Subjective: *He continues to hiccup although he's not experiencing paroxysms as he did before.**  Objective:  Vital signs in last 24 hours: Temp:  [98.2 F (36.8 C)-98.5 F (36.9 C)] 98.2 F (36.8 C) (03/30 0459) Pulse Rate:  [80-87] 80 (03/30 0459) Resp:  [16-17] 17 (03/30 0459) BP: (132-146)/(58-69) 144/61 mmHg (03/30 0459) SpO2:  [93 %-95 %] 94 % (03/30 0459) Weight:  [142 lb 12.8 oz (64.774 kg)] 142 lb 12.8 oz (64.774 kg) (03/30 0459) Last BM Date: 07/22/12 General:   Alert,  Well-developed,  white male in NAD Heart:  Regular rate and rhythm; no murmurs Abdomen:  Soft, nontender and nondistended. Normal bowel sounds, without guarding, and without rebound.   Extremities:  Without edema. Neurologic:  Alert and  oriented x4;  grossly normal neurologically. Psych:  Alert and cooperative. Normal mood and affect.  Intake/Output from previous day: 03/29 0701 - 03/30 0700 In: 120 [P.O.:120] Out: 900 [Urine:900] Intake/Output this shift:    Lab Results: No results found for this basename: WBC, HGB, HCT, PLT,  in the last 72 hours BMET  Recent Labs  07/22/12 0443 07/23/12 0517  NA 134* 131*  K 3.9 4.4  CL 98 95*  CO2 25 23  GLUCOSE 111* 107*  BUN 22 22  CREATININE 1.22 1.20  CALCIUM 8.7 8.9   LFT No results found for this basename: PROT, ALBUMIN, AST, ALT, ALKPHOS, BILITOT, BILIDIR, IBILI,  in the last 72 hours PT/INR No results found for this basename: LABPROT, INR,  in the last 72 hours Hepatitis Panel No results found for this basename: HEPBSAG, HCVAB, HEPAIGM, HEPBIGM,  in the last 72 hours  Studies/Results: No results found.   Assessment / Plan: Hiccups - no real improvement with addition of flucanazole. Will add carafate slurry, antacids prn. ** Principal Problem:   Nausea & vomiting Active Problems:   Acute CHF   CKD (chronic kidney disease)   CAD (coronary artery disease)   HTN (hypertension)  Hiccoughs   Acute esophagitis     LOS: 12 days   Melvia Heaps  07/24/2012, 9:31 AM

## 2012-07-24 NOTE — Progress Notes (Signed)
[facility] PROGRESS NOTE  Kirk Townsend ZOX:[id number] DOB: 07-10-1928 DOA: 07/12/2012 PCP: Londell Moh, MD  Assessment/Plan: #1 acute hypoxic respiratory failure Multifactorial in nature secondary to pneumonia and CHF. Patient with clinical improvement. Patient has been transitioned to oral Levaquin. Patient received 1 dose of IV Lasix on 07/15/2012. Continue home dose Lasix. Follow.  #2 nausea and vomiting with hiccups Unknown etiology. May have been secondary to Candida esophagitis noted on EGD. Patient with no further nausea or vomiting and tolerating oral intake. Lipase is at baseline. Abdominal ultrasound cholelithiasis however no acute cholecystitis. Patient has been seen by general surgery and no further workup is needed at this time. Patient with significant hiccups at this time. Continue reglan and thorazine prn. Patient was seen by GI, Dr. Elnoria Howard, patient underwent an upper endoscopy today which showed some Candida esophagitis and gastritis. Biopsies are pending. Patient has been started on Diflucan x10 days. GI following and appreciate input and recommendations.  #3 Candida esophagitis The EGD on 07/22/2012. Diflucan x10 days. Biopsies pending. GI following and appreciate input and recommendations.  #4 bladder outlet obstruction Continue Foley catheter. Will need outpatient urology followup.  #5 slurred speech on 07/14/2012 Resolved. MRI negative. Patient at baseline. Follow.  #6 chronic combined systolic and diastolic CHF EF of 45% in May of 2013. Repeat 2-D echo with EF of 50% 07/13/2012. Hold home dose Lasix. Follow.  #7 chronic kidney disease stage II to III Stable.  #8 hypokalemia Repleted.  Code Status: Full Family Communication: Updated patient, wife at bedside. Disposition Plan: SNF when medically stable   Consultants: GI consultation over the phone per Dr Izola Price Surgery  GI: Dr. Elnoria Howard 07/21/2012  Procedures: Mr Brain Wo Contrast 07/14/2012  No  acute infarct. No acute intracranial abnormality identified. Cerebral volume loss and mild to moderate nonspecific cerebral white matter signal changes.The examination had to be discontinued prior to completion.  Dg Chest Port 1 View 07/15/2012  Asymmetric airspace consolidation in the right upper lobe concerning for pneumonia. Cardiomegaly with cephalization of the pulmonary vasculature and diffuse interstitial prominence suggestive of background of mild interstitial pulmonary edema, possibly related to congestive heart failure.  Dg Chest 2 View 07/12/2012  Stable COPD/emphysema and stable mild cardiomegaly. No acute cardiopulmonary disease.  US Abdomen Complete 07/13/2012  Solitary gallstone. No findings of cholecystitis. Minimal prominence of the pancreatic duct, nonspecific. Otherwise, normal appearing abdomen.  Ct Abdomen Pelvis W Contrast 07/12/2012  Cholelithiasis. Suggestion of small bowel wall thickening involving the jejunum and liquid stool in the colon. Changes likely to represent gastroenteritis or other inflammatory process. Mild fatty infiltration of the liver.  EGD 07/22/2012  Antibiotics Vancomycin 03/22 -> 03/24  Levaquin 03/22 -> changed to PO 03/24 --> 07/23/12 Maxipime 03/22 --> 03/24      HPI/Subjective: Patient states with hiccups worse this afternoon. Patient denies any shortness of breath. Patient denies CP.   Objective: Filed Vitals:   07/23/12 1330 07/23/12 2139 07/24/12 0459 07/24/12 1415  BP: 132/69 146/58 144/61 160/63  Pulse: 87 83 80 77  Temp: 98.5 F (36.9 C) 98.5 F (36.9 C) 98.2 F (36.8 C) 97.5 F (36.4 C)  TempSrc: Oral Oral Axillary Oral  Resp: 16 16 17 18   Height:      Weight:   64.774 kg (142 lb 12.8 oz)   SpO2: 95% 93% 94% 97%    Intake/Output Summary (Last 24 hours) at 07/24/12 1551 Last data filed at 07/24/12 1300  Gross per 24 hour  Intake    120  ml  Output    800 ml  Net   -680 ml   Filed Weights   07/22/12 0435 07/23/12 0543  07/24/12 0459  Weight: 66.2 kg (145 lb 15.1 oz) 64.864 kg (143 lb) 64.774 kg (142 lb 12.8 oz)    Exam:   General:  NAD  Cardiovascular: RRR  Respiratory: CTAB anterior lung fields  Abdomen: Soft/NT/ND/+BS  EXT: No c/c/e  Data Reviewed: Basic Metabolic Panel:  Recent Labs Lab 07/18/12 0410 07/19/12 0449 07/20/12 0458 07/21/12 0455 07/22/12 0443 07/23/12 0517  NA 133*  --  135 134* 134* 131*  K 3.6  --  3.9 4.0 3.9 4.4  CL 101  --  101 99 98 95*  CO2 22  --  23 24 25 23   GLUCOSE 107*  --  111* 116* 111* 107*  BUN 28*  --  19 21 22 22   CREATININE 1.30 1.21 1.18 1.14 1.22 1.20  CALCIUM 8.8  --  8.7 8.7 8.7 8.9   Liver Function Tests:  Recent Labs Lab 07/19/12 0449  AST 62*  ALT 48  ALKPHOS 97  BILITOT 0.4  PROT 5.6*  ALBUMIN 2.3*    Recent Labs Lab 07/19/12 0449 07/20/12 0458  LIPASE 67* 31   No results found for this basename: AMMONIA,  in the last 168 hours CBC:  Recent Labs Lab 07/18/12 0410 07/19/12 0449 07/20/12 0458  WBC 8.5 9.6 10.8*  HGB 12.2* 12.1* 10.8*  HCT 34.5* 34.7* 31.2*  MCV 93.8 95.3 94.8  PLT 271 309 318   Cardiac Enzymes: No results found for this basename: CKTOTAL, CKMB, CKMBINDEX, TROPONINI,  in the last 168 hours BNP (last 3 results)  Recent Labs  09/01/11 1500  PROBNP 2960.0*   CBG: No results found for this basename: GLUCAP,  in the last 168 hours  Recent Results (from the past 240 hour(s))  CULTURE, BLOOD (ROUTINE X 2)     Status: None   Collection Time    07/16/12  7:40 AM      Result Value Range Status   Specimen Description BLOOD LEFT ARM   Final   Special Requests BOTTLES DRAWN AEROBIC AND ANAEROBIC 5CC   Final   Culture  Setup Time 07/16/2012 14:49   Final   Culture NO GROWTH 5 DAYS   Final   Report Status 07/22/2012 FINAL   Final  CULTURE, BLOOD (ROUTINE X 2)     Status: None   Collection Time    07/16/12  7:45 AM      Result Value Range Status   Specimen Description BLOOD RIGHT ARM   Final    Special Requests BOTTLES DRAWN AEROBIC AND ANAEROBIC 5CC   Final   Culture  Setup Time 07/16/2012 14:49   Final   Culture NO GROWTH 5 DAYS   Final   Report Status 07/22/2012 FINAL   Final     Studies: No results found.  Scheduled Meds: . amphetamine-dextroamphetamine  20 mg Oral BID AC  . Armodafinil  250 mg Oral Daily  . aspirin  81 mg Oral Daily  . [START ON 07/25/2012] bisoprolol  10 mg Oral Daily  . bisoprolol  5 mg Oral Once  . buPROPion  150 mg Oral Daily  . donepezil  5 mg Oral QHS  . enoxaparin (LOVENOX) injection  40 mg Subcutaneous Q24H  . feeding supplement  1 Container Oral Q24H  . fluconazole  100 mg Oral Daily  . lactose free nutrition  237 mL Oral Q24H  .  metoCLOPramide (REGLAN) injection  10 mg Intravenous Once  . multivitamin with minerals  1 tablet Oral Daily  . pantoprazole  40 mg Oral BID AC  . sertraline  50 mg Oral Daily  . sodium chloride  3 mL Intravenous Q12H  . Sodium Oxybate  4,000 mg Oral Custom  . sucralfate  1 g Oral Q6H   Continuous Infusions:   Principal Problem:   Nausea & vomiting Active Problems:   Acute CHF   CKD (chronic kidney disease)   CAD (coronary artery disease)   HTN (hypertension)   Hiccoughs   Acute esophagitis    Time spent: > 35 mins    [facility]  [facility] Pager [phone]. If 7PM-7AM, please contact night-coverage at [email or web address], password [facility] 07/24/2012, 3:51 PM  LOS: 12 days

## 2012-07-25 ENCOUNTER — Encounter (HOSPITAL_COMMUNITY): Payer: Self-pay | Admitting: Gastroenterology

## 2012-07-25 DIAGNOSIS — I10 Essential (primary) hypertension: Secondary | ICD-10-CM | POA: Diagnosis not present

## 2012-07-25 DIAGNOSIS — E7800 Pure hypercholesterolemia, unspecified: Secondary | ICD-10-CM | POA: Diagnosis present

## 2012-07-25 DIAGNOSIS — D72829 Elevated white blood cell count, unspecified: Secondary | ICD-10-CM | POA: Diagnosis present

## 2012-07-25 DIAGNOSIS — Z9861 Coronary angioplasty status: Secondary | ICD-10-CM | POA: Diagnosis not present

## 2012-07-25 DIAGNOSIS — R066 Hiccough: Secondary | ICD-10-CM | POA: Diagnosis not present

## 2012-07-25 DIAGNOSIS — I5023 Acute on chronic systolic (congestive) heart failure: Secondary | ICD-10-CM | POA: Diagnosis present

## 2012-07-25 DIAGNOSIS — Z7982 Long term (current) use of aspirin: Secondary | ICD-10-CM | POA: Diagnosis not present

## 2012-07-25 DIAGNOSIS — J9601 Acute respiratory failure with hypoxia: Secondary | ICD-10-CM | POA: Diagnosis present

## 2012-07-25 DIAGNOSIS — K1370 Unspecified lesions of oral mucosa: Secondary | ICD-10-CM | POA: Diagnosis present

## 2012-07-25 DIAGNOSIS — Z87891 Personal history of nicotine dependence: Secondary | ICD-10-CM | POA: Diagnosis not present

## 2012-07-25 DIAGNOSIS — R0902 Hypoxemia: Secondary | ICD-10-CM | POA: Diagnosis present

## 2012-07-25 DIAGNOSIS — J189 Pneumonia, unspecified organism: Secondary | ICD-10-CM | POA: Diagnosis present

## 2012-07-25 DIAGNOSIS — N189 Chronic kidney disease, unspecified: Secondary | ICD-10-CM | POA: Diagnosis not present

## 2012-07-25 DIAGNOSIS — R112 Nausea with vomiting, unspecified: Secondary | ICD-10-CM | POA: Diagnosis not present

## 2012-07-25 DIAGNOSIS — K2270 Barrett's esophagus without dysplasia: Secondary | ICD-10-CM | POA: Diagnosis present

## 2012-07-25 DIAGNOSIS — Z79899 Other long term (current) drug therapy: Secondary | ICD-10-CM | POA: Diagnosis not present

## 2012-07-25 DIAGNOSIS — I2510 Atherosclerotic heart disease of native coronary artery without angina pectoris: Secondary | ICD-10-CM | POA: Diagnosis present

## 2012-07-25 DIAGNOSIS — K209 Esophagitis, unspecified without bleeding: Secondary | ICD-10-CM | POA: Diagnosis not present

## 2012-07-25 DIAGNOSIS — F0390 Unspecified dementia without behavioral disturbance: Secondary | ICD-10-CM | POA: Diagnosis present

## 2012-07-25 DIAGNOSIS — G47419 Narcolepsy without cataplexy: Secondary | ICD-10-CM | POA: Diagnosis present

## 2012-07-25 DIAGNOSIS — R0602 Shortness of breath: Secondary | ICD-10-CM | POA: Diagnosis not present

## 2012-07-25 DIAGNOSIS — I252 Old myocardial infarction: Secondary | ICD-10-CM | POA: Diagnosis not present

## 2012-07-25 DIAGNOSIS — I509 Heart failure, unspecified: Secondary | ICD-10-CM | POA: Diagnosis not present

## 2012-07-25 DIAGNOSIS — K859 Acute pancreatitis without necrosis or infection, unspecified: Secondary | ICD-10-CM | POA: Diagnosis not present

## 2012-07-25 DIAGNOSIS — B3781 Candidal esophagitis: Secondary | ICD-10-CM | POA: Diagnosis present

## 2012-07-25 DIAGNOSIS — Z5189 Encounter for other specified aftercare: Secondary | ICD-10-CM | POA: Diagnosis not present

## 2012-07-25 DIAGNOSIS — J9600 Acute respiratory failure, unspecified whether with hypoxia or hypercapnia: Secondary | ICD-10-CM | POA: Diagnosis not present

## 2012-07-25 DIAGNOSIS — J690 Pneumonitis due to inhalation of food and vomit: Secondary | ICD-10-CM | POA: Diagnosis present

## 2012-07-25 DIAGNOSIS — R0989 Other specified symptoms and signs involving the circulatory and respiratory systems: Secondary | ICD-10-CM | POA: Diagnosis not present

## 2012-07-25 DIAGNOSIS — J811 Chronic pulmonary edema: Secondary | ICD-10-CM | POA: Diagnosis not present

## 2012-07-25 DIAGNOSIS — I119 Hypertensive heart disease without heart failure: Secondary | ICD-10-CM | POA: Diagnosis not present

## 2012-07-25 LAB — BASIC METABOLIC PANEL
CO2: 25 mEq/L (ref 19–32)
Chloride: 97 mEq/L (ref 96–112)
GFR calc Af Amer: 65 mL/min — ABNORMAL LOW (ref >90)
Potassium: 4.1 mEq/L (ref 3.5–5.1)
Sodium: 132 mEq/L — ABNORMAL LOW (ref 135–145)

## 2012-07-25 MED ORDER — TAMSULOSIN HCL 0.4 MG PO CAPS: 0.4000 mg | ORAL_CAPSULE | Freq: Every day | ORAL | Status: AC

## 2012-07-25 MED ORDER — FLUCONAZOLE 100 MG PO TABS: 100.0000 mg | ORAL_TABLET | Freq: Every day | ORAL | Status: AC

## 2012-07-25 MED ORDER — FUROSEMIDE 20 MG PO TABS: 20.0000 mg | ORAL_TABLET | Freq: Every day | ORAL | Status: AC

## 2012-07-25 MED ORDER — CHLORPROMAZINE HCL 50 MG PO TABS: 50.0000 mg | ORAL_TABLET | Freq: Four times a day (QID) | ORAL | Status: AC | PRN

## 2012-07-25 MED ORDER — MAGIC MOUTHWASH W/LIDOCAINE: 15.0000 mL | Freq: Four times a day (QID) | ORAL | Status: AC

## 2012-07-25 MED ORDER — SUCRALFATE 1 GM/10ML PO SUSP: 1.0000 g | Freq: Four times a day (QID) | ORAL | Status: AC

## 2012-07-25 MED ORDER — BISOPROLOL FUMARATE 10 MG PO TABS: 10.0000 mg | ORAL_TABLET | Freq: Every day | ORAL | Status: AC

## 2012-07-25 MED ORDER — PANTOPRAZOLE SODIUM 40 MG PO TBEC: 40.0000 mg | DELAYED_RELEASE_TABLET | Freq: Two times a day (BID) | ORAL | Status: AC

## 2012-07-25 NOTE — Progress Notes (Signed)
Patient is cleared for discharge. Packet is copied and placed in [place]. ptar called for transportation. Patient and spouse at bedside aware and agreeable.  Kirk Beckner C. Kirk Townsend MSW, LCSW [phone]

## 2012-07-25 NOTE — Discharge Summary (Signed)
Physician Discharge Summary  Kirk Townsend ZOX:[id number] DOB: 1929/04/08 DOA: 07/12/2012  PCP: Londell Moh, MD  Admit date: 07/12/2012 Discharge date: 07/25/2012  Time spent: 65 minutes  Recommendations for Outpatient Follow-up:  1. Patient is to followup with PCP one week post discharge. Will followup a basic metabolic profile need to be obtained to followup on patient's electrolytes and renal function. 2. Patient is to followup with Dr. Elnoria Howard of gastroenterology 2 weeks post discharge for followup on his Candida esophagitis. 3. Patient be discharged with a Foley catheter will need a referral to followup with urology as outpatient for voiding trial and further evaluation for his bladder outlet obstruction.  Discharge Diagnoses:  Principal Problem:   Acute respiratory failure with hypoxia Active Problems:   Acute CHF   CKD (chronic kidney disease)   CAD (coronary artery disease)   HTN (hypertension)   Nausea & vomiting   Hiccoughs   Acute esophagitis   Singultus   CAP (community acquired pneumonia)   Discharge Condition: Stable  Diet recommendation: Heart healthy  Filed Weights   07/23/12 0543 07/24/12 0459 07/25/12 0545  Weight: 64.864 kg (143 lb) 64.774 kg (142 lb 12.8 oz) 65 kg (143 lb 4.8 oz)    History of present illness:  Kirk Townsend is a 79 y.o. male history of CAD status post angioplasty, narcolepsy, hypertension, CHF, chronic kidney disease was brought to the ER because of nausea vomiting. Patient has been having hiccups for last 2 weeks. But last 2 days patient has been having nausea vomiting. Patient denies any abdominal pain or diarrhea. Has been unable to keep in anything because of the hiccups and nausea vomiting. In the ER patient had CT abdomen and pelvis which show features for possible gastroenteritis. Also shows cholelithiasis. Labs show mildly elevated lipase but patient denies any abdominal pain. Patient at this time will be admitted for further  management. Patient's EKG shows ST-T changes in the lateral leads but patient does not have any chest pain or shortness of breath.      Hospital Course:  #1 acute hypoxic respiratory failure/community acquired pneumonia Patient was admitted with acute hypoxic respiratory failure felt to be initially multifactorial secondary to pneumonia probable simple CHF. Patient was given a dose of IV Lasix x1 and treated empirically with IV antibiotics of vancomycin and Maxipime.. Patient was subsequently transitioned to oral Levaquin which she tolerated and patient received one week's worth of antibiotics. Patient improved clinically and WILL be discharged back on his home regimen of Lasix 20 mg daily. Patient be discharged in stable and improved condition. #2 nausea and vomiting with hiccups/singultus  During the hospitalization patient was noted to some nausea and emesis and also did have some hiccups which had started 2 weeks prior to admission. Patient initially was felt to have an acute pancreatitis which was treated out of did have improvement in his nausea and vomiting but no significant improvement in his hiccups. Abdominal ultrasound which was done during this hospitalization did show cholelithiasis however did not show any acute cholecystitis. Due to concerns from patient's family that gallbladder disease may be called and his pickups a general surgical consultation was obtained and patient was seen in consultation by Dr.Tsei. It was felt per Gen. surgery that patient did not have acute cholecystitis and cholelithiasis was not the etiology of his checkups. A GI consultation was subsequently obtained and patient was seen in consultation by Dr. Elnoria Howard who performed an upper endoscopy which showed Candida esophagitis, nonobstructive Schatzki's ring and  proximal gastritis. It was felt patient's Candida esophagitis may have been the etiology of his hiccups/singultus. Patient was subsequently started on fluconazole  and Protonix was increased to twice a day. Sucralfate was added to patient's regimen as well as Magic mouthwash. Patient was also placed on Thorazine as needed. Patient did have some slow improvement in his hiccups, which became less frequent. Patient will be discharged in stable and improved condition to followup with PCP as outpatient.   #3 Candida esophagitis  During the workup of patient's singultus a GI consultation was obtained and patient was seen in consultation by Dr. Elnoria Howard. An upper endoscopy was done on 07/22/2012, which revealed Candida esophagitis, nonobstructive Schatzki's ring, and proximal gastritis. It was recommended that patient be treated with fluconazole 100 mg daily x10 days. Patient was started on fluconazole and will be discharged on 7 more days of fluconazole to complete a ten-day course of antibiotic therapy. Patient be discharged in stable condition. Patient will followup with Dr. Elnoria Howard of GI in approximately 2 weeks. #4 bladder outlet obstruction  During the hospitalization patient was noted to have a bladder obstruction. Patient was placed on a Foley catheter. Patient will be discharged with the Foley catheter and Flomax, and patient will need to followup with urology as outpatient for voiding trial and further evaluation and management. #5 slurred speech on 07/14/2012  Resolved. MRI negative. Patient at baseline. Follow.  #6 chronic combined systolic and diastolic CHF  DURING THE HOSPITALIZATION ON ADMISSION IT WAS FELT PATIENT MAY HAVE HAD A COMPLEMENT OF CHF AND WAS GIVEN A DOSE OF IV LASIX. EF of 45% in May of 2013. Repeat 2-D echo with EF of 50% 07/13/2012. Patient's Lasix was held and will be resumed on discharge. Patient will followup with PCP as outpatient.      Procedures: Mr Brain Wo Contrast 07/14/2012  No acute infarct. No acute intracranial abnormality identified. Cerebral volume loss and mild to moderate nonspecific cerebral white matter signal changes.The  examination had to be discontinued prior to completion.  Dg Chest Port 1 View 07/15/2012  Asymmetric airspace consolidation in the right upper lobe concerning for pneumonia. Cardiomegaly with cephalization of the pulmonary vasculature and diffuse interstitial prominence suggestive of background of mild interstitial pulmonary edema, possibly related to congestive heart failure.  Dg Chest 2 View 07/12/2012  Stable COPD/emphysema and stable mild cardiomegaly. No acute cardiopulmonary disease.  US Abdomen Complete 07/13/2012  Solitary gallstone. No findings of cholecystitis. Minimal prominence of the pancreatic duct, nonspecific. Otherwise, normal appearing abdomen.  Ct Abdomen Pelvis W Contrast 07/12/2012  Cholelithiasis. Suggestion of small bowel wall thickening involving the jejunum and liquid stool in the colon. Changes likely to represent gastroenteritis or other inflammatory process. Mild fatty infiltration of the liver.  EGD 07/22/2012 2-D echo 07/13/2012   Consultations: GI consultation over the phone per Dr Izola Price  Surgery  GI: Dr. Elnoria Howard 07/21/2012     Discharge Exam: Filed Vitals:   07/24/12 1415 07/24/12 1545 07/24/12 2147 07/25/12 0545  BP: 160/63  141/60 146/61  Pulse: 77  74 72  Temp: 97.5 F (36.4 C)  98.3 F (36.8 C) 98.4 F (36.9 C)  TempSrc: Oral  Oral Oral  Resp: 18  16 20   Height:      Weight:    65 kg (143 lb 4.8 oz)  SpO2: 97% 87% 97% 95%    General: nad Cardiovascular: RRR Respiratory: CTAB  Discharge Instructions      Discharge Orders   Future Orders Complete By Expires  Diet - low sodium heart healthy  As directed     Discharge instructions  As directed     Comments:      fOLLOW UP WITH PHARR,WALTER DAVIDSON, MD IN 1 WEEK    Increase activity slowly  As directed         Medication List    TAKE these medications       amphetamine-dextroamphetamine 10 MG tablet  Commonly known as:  ADDERALL  Take 20 mg by mouth 2 (two) times daily.      aspirin 81 MG chewable tablet  Chew 81 mg by mouth daily.     bisoprolol 10 MG tablet  Commonly known as:  ZEBETA  Take 1 tablet (10 mg total) by mouth daily.     buPROPion 150 MG 24 hr tablet  Commonly known as:  WELLBUTRIN XL  Take 150 mg by mouth daily.     calcium carbonate 1250 MG tablet  Commonly known as:  OS-CAL - dosed in mg of elemental calcium  Take 1 tablet by mouth daily.     chlorproMAZINE 50 MG tablet  Commonly known as:  THORAZINE  Take 1 tablet (50 mg total) by mouth 4 (four) times daily as needed. For hiccups     donepezil 5 MG tablet  Commonly known as:  ARICEPT  Take 5 mg by mouth at bedtime.     feeding supplement Liqd  Take 1 Container by mouth daily.     lactose free nutrition Liqd  Take 237 mLs by mouth daily.     fluconazole 100 MG tablet  Commonly known as:  DIFLUCAN  Take 1 tablet (100 mg total) by mouth daily. Take for 7 days.     furosemide 20 MG tablet  Commonly known as:  LASIX  Take 1 tablet (20 mg total) by mouth daily.     magic mouthwash w/lidocaine Soln  Take 15 mLs by mouth 4 (four) times daily. tAKE FOR 10 DAYS THEN USE AS NEEDED     multivitamin with minerals Tabs  Take 1 tablet by mouth daily.     NUVIGIL 250 MG tablet  Generic drug:  Armodafinil  Take 250 mg by mouth daily.     pantoprazole 40 MG tablet  Commonly known as:  PROTONIX  Take 1 tablet (40 mg total) by mouth 2 (two) times daily before a meal.     promethazine 25 MG tablet  Commonly known as:  PHENERGAN  Take 1 tablet (25 mg total) by mouth every 6 (six) hours as needed for nausea. For hiccups     sertraline 50 MG tablet  Commonly known as:  ZOLOFT  Take 50 mg by mouth daily.     simvastatin 20 MG tablet  Commonly known as:  ZOCOR  Take 20 mg by mouth every evening.     sucralfate 1 GM/10ML suspension  Commonly known as:  CARAFATE  Take 10 mLs (1 g total) by mouth every 6 (six) hours. tAKE FOR 10 DAYS THEN USE AS NEEDED.     tamsulosin 0.4 MG Caps   Commonly known as:  FLOMAX  Take 1 capsule (0.4 mg total) by mouth daily after supper.     vitamin C 500 MG tablet  Commonly known as:  ASCORBIC ACID  Take 500 mg by mouth daily.     XYREM PO  Take 4 g by mouth 2 (two) times daily.       Follow-up Information   Follow up with Londell Moh, MD.  Schedule an appointment as soon as possible for a visit in 1 week.   Contact information:   [address] [place] [address] [place] [address] [phone]       Follow up with HUNG,PATRICK D, MD. Schedule an appointment as soon as possible for a visit in 2 weeks.   Contact information:   [address] [place] [address] [phone]       Please follow up. (Will need a referral to followup with urology 1-2 weeks as outpatient)        The results of significant diagnostics from this hospitalization (including imaging, microbiology, ancillary and laboratory) are listed below for reference.    Significant Diagnostic Studies: Dg Chest 2 View  07/12/2012  *RADIOLOGY REPORT*  Clinical Data: Generalized weakness.  Hiccups.  Chest discomfort.  CHEST - 2 VIEW  Comparison: Two-view chest x-ray 11/23/2011, 09/01/2011 and 01/06/2007 [facility].  Findings: Cardiac silhouette mildly enlarged but stable.  Thoracic aorta atherosclerotic, unchanged.  Hilar and mediastinal contours otherwise unremarkable.  Hyperinflation, unchanged.  Lungs clear. Bronchovascular markings normal.  Pulmonary vascularity normal.  No pneumothorax.  No pleural effusions.  Old healed fracture involving the distal left clavicle and degenerative changes throughout the thoracic spine.  IMPRESSION: Stable COPD/emphysema and stable mild cardiomegaly.  No acute cardiopulmonary disease.   Original Report Authenticated By: Hulan Saas, M.D.    Mr Brain Wo Contrast  07/14/2012  *RADIOLOGY REPORT*  Clinical Data: 79 year old male with sudden onset slurred speech.  MRI HEAD WITHOUT CONTRAST   Technique:  Multiplanar, multiecho pulse sequences of the brain and surrounding structures were obtained according to standard protocol without intravenous contrast.  Comparison: None.  Findings: The examination had to be discontinued prior to completion due to patient refusal to continue.  Axial diffusion, T2 and FLAIR imaging plus sagittal T1-weighted imaging was acquired.  No restricted diffusion to suggest acute infarction.  Major intracranial vascular flow voids are preserved.  Cerebral volume loss appears fairly generalized with ex vacuo changes of the ventricles.   No midline shift or other intracranial mass effect. Sulcal variation or disproportionate volume loss at the right calcarine sulcus.  Cannot exclude a small left vertex arachnoid cyst mildly affecting the left superior frontal gyrus (series 3 image 14 and series 7 image 25). No definite cortical encephalomalacia.  Mild to moderate for age nonspecific periventricular white matter T2 and FLAIR hyperintensity.  Negative pituitary gland, cervicomedullary junction and visualized cervical spine.  Normal bone marrow signal.  Postoperative changes to the globes. Visualized paranasal sinuses and mastoids are clear.  IMPRESSION: 1.  No acute infarct. No acute intracranial abnormality identified. 2.  Cerebral volume loss and mild to moderate nonspecific cerebral white matter signal changes. 3. The examination had to be discontinued prior to completion.  .   Original Report Authenticated By: Erskine Speed, M.D.    US Abdomen Complete  07/13/2012  *RADIOLOGY REPORT*  Clinical Data:  Abdominal pain.  COMPLETE ABDOMINAL ULTRASOUND  Comparison:  CT scan dated 07/12/2012  Findings:  Gallbladder:  There is a solitary 14 mm gallstone which moves with change in patient position.  Gallbladder wall is not thickened. Negative sonographic Murphy's sign.  The  Common bile duct:  Normal.  5.4 mm diameter.  Liver:  Normal.  IVC:  Normal.  Pancreas:  The pancreatic parenchyma  is normal.  Pancreatic duct is slightly prominent at 2.7 mm.  No visible mass in the pancreatic head.  Spleen:  Normal.  4.6 cm in length.  Right Kidney:  Normal.  9.6 cm in length.  Left Kidney:  Normal.  8.9 cm in length.  Abdominal aorta:  Normal.  2.7 cm maximal diameter.  There are bilateral pleural effusions, left greater than right.  IMPRESSION: Solitary gallstone.  No findings of cholecystitis.  Minimal prominence of the pancreatic duct, nonspecific.  Otherwise, normal appearing abdomen.   Original Report Authenticated By: Francene Boyers, M.D.    Ct Abdomen Pelvis W Contrast  07/12/2012  *RADIOLOGY REPORT*  Clinical Data: Vomiting since last night.  Generalized weakness and hiccups.  Shortness of breath.  CT ABDOMEN AND PELVIS WITH CONTRAST  Technique:  Multidetector CT imaging of the abdomen and pelvis was performed following the standard protocol during bolus administration of intravenous contrast.  Contrast: 80mL OMNIPAQUE IOHEXOL 300 MG/ML  SOLN  Comparison: None.  Findings: Technically limited study due to motion artifact.  Dependent atelectasis and motion artifact in the lung bases. Pectus excavatum.  Cardiac enlargement.  Mild diffuse fatty infiltration of the liver.  No focal liver lesions.  Cholelithiasis with single gallstone in the dependent portion of the gallbladder.  No gallbladder wall thickening or edema.  The pancreas is atrophic.  Spleen size is normal.  No adrenal gland nodules.  No solid mass or hydronephrosis in either kidney.  The stomach is not abnormally distended.  Small bowel are not distended but there is suggestion of wall thickening of left upper quadrant jejunal loops.  This may represent gastroenteritis or inflammatory process.  Distal small bowel are unremarkable. Colon is fluid-filled consistent with liquid stool.  No colonic distension.  No free air or free fluid in the abdomen.  Pelvis:  Prostate gland does not appear enlarged.  Bladder wall is not thickened.  No free or  loculated pelvic fluid collections.  No pelvic lymphadenopathy.  Appendix is not identified.  No diverticulitis despite multiple sigmoid diverticula.  Degenerative changes throughout the lumbar spine.  No destructive bone lesions.  IMPRESSION: Cholelithiasis.  Suggestion of small bowel wall thickening involving the jejunum and liquid stool in the colon.  Changes likely to represent gastroenteritis or other inflammatory process. Mild fatty infiltration of the liver.   Original Report Authenticated By: Burman Nieves, M.D.    Dg Chest Port 1 View  07/15/2012  *RADIOLOGY REPORT*  Clinical Data: Shortness of breath, weakness and fatigue.  PORTABLE CHEST - 1 VIEW  Comparison: Chest x-ray 07/12/2012.  Findings: Compared to the prior study there is now diffuse interstitial prominence throughout all aspects of the lungs bilaterally.  Importantly, however, there is asymmetric airspace consolidation involving the right upper lobe.  No definite pleural effusions.  Pulmonary vasculature appears engorged.  Mild cardiomegaly is unchanged.  Atherosclerosis in the thoracic aorta.  IMPRESSION: 1.  Asymmetric airspace consolidation in the right upper lobe concerning for pneumonia. 2.  Cardiomegaly with cephalization of the pulmonary vasculature and diffuse interstitial prominence suggestive of background of mild interstitial pulmonary edema, possibly related to congestive heart failure. 3.  Atherosclerosis.   Original Report Authenticated By: Trudie Reed, M.D.     Microbiology: Recent Results (from the past 240 hour(s))  CULTURE, BLOOD (ROUTINE X 2)     Status: None   Collection Time    07/16/12  7:40 AM      Result Value Range Status   Specimen Description BLOOD LEFT ARM   Final   Special Requests BOTTLES DRAWN AEROBIC AND ANAEROBIC 5CC   Final   Culture  Setup Time 07/16/2012 14:49   Final   Culture  NO GROWTH 5 DAYS   Final   Report Status 07/22/2012 FINAL   Final  CULTURE, BLOOD (ROUTINE X 2)     Status: None    Collection Time    07/16/12  7:45 AM      Result Value Range Status   Specimen Description BLOOD RIGHT ARM   Final   Special Requests BOTTLES DRAWN AEROBIC AND ANAEROBIC 5CC   Final   Culture  Setup Time 07/16/2012 14:49   Final   Culture NO GROWTH 5 DAYS   Final   Report Status 07/22/2012 FINAL   Final     Labs: Basic Metabolic Panel:  Recent Labs Lab 07/20/12 0458 07/21/12 0455 07/22/12 0443 07/23/12 0517 07/25/12 0421  NA 135 134* 134* 131* 132*  K 3.9 4.0 3.9 4.4 4.1  CL 101 99 98 95* 97  CO2 23 24 25 23 25   GLUCOSE 111* 116* 111* 107* 107*  BUN 19 21 22 22 20   CREATININE 1.18 1.14 1.22 1.20 1.17  CALCIUM 8.7 8.7 8.7 8.9 8.5   Liver Function Tests:  Recent Labs Lab 07/19/12 0449  AST 62*  ALT 48  ALKPHOS 97  BILITOT 0.4  PROT 5.6*  ALBUMIN 2.3*    Recent Labs Lab 07/19/12 0449 07/20/12 0458  LIPASE 67* 31   No results found for this basename: AMMONIA,  in the last 168 hours CBC:  Recent Labs Lab 07/19/12 0449 07/20/12 0458  WBC 9.6 10.8*  HGB 12.1* 10.8*  HCT 34.7* 31.2*  MCV 95.3 94.8  PLT 309 318   Cardiac Enzymes: No results found for this basename: CKTOTAL, CKMB, CKMBINDEX, TROPONINI,  in the last 168 hours BNP: BNP (last 3 results)  Recent Labs  09/01/11 1500  PROBNP 2960.0*   CBG: No results found for this basename: GLUCAP,  in the last 168 hours     Signed:  Damascus Feldpausch  [facility] 07/25/2012, 11:07 AM

## 2012-07-25 NOTE — Progress Notes (Signed)
Subjective: Singultus is better.  He is able to eat without significant problems.  Objective: Vital signs in last 24 hours: Temp:  [97.5 F (36.4 C)-98.4 F (36.9 C)] 98.4 F (36.9 C) (03/31 0545) Pulse Rate:  [72-77] 72 (03/31 0545) Resp:  [16-20] 20 (03/31 0545) BP: (141-160)/(60-63) 146/61 mmHg (03/31 0545) SpO2:  [87 %-97 %] 95 % (03/31 0545) Weight:  [143 lb 4.8 oz (65 kg)] 143 lb 4.8 oz (65 kg) (03/31 0545) Last BM Date: 07/22/12  Intake/Output from previous day: 03/30 0701 - 03/31 0700 In: 615 [P.O.:615] Out: 1000 [Urine:1000] Intake/Output this shift: Total I/O In: 480 [P.O.:480] Out: 400 [Urine:400]  General appearance: alert and no distress GI: soft, non-tender; bowel sounds normal; no masses,  no organomegaly  Lab Results: No results found for this basename: WBC, HGB, HCT, PLT,  in the last 72 hours BMET  Recent Labs  07/23/12 0517 07/25/12 0421  NA 131* 132*  K 4.4 4.1  CL 95* 97  CO2 23 25  GLUCOSE 107* 107*  BUN 22 20  CREATININE 1.20 1.17  CALCIUM 8.9 8.5   LFT No results found for this basename: PROT, ALBUMIN, AST, ALT, ALKPHOS, BILITOT, BILIDIR, IBILI,  in the last 72 hours PT/INR No results found for this basename: LABPROT, INR,  in the last 72 hours Hepatitis Panel No results found for this basename: HEPBSAG, HCVAB, HEPAIGM, HEPBIGM,  in the last 72 hours C-Diff No results found for this basename: CDIFFTOX,  in the last 72 hours Fecal Lactopherrin No results found for this basename: FECLLACTOFRN,  in the last 72 hours  Studies/Results: No results found.  Medications:  Scheduled: . amphetamine-dextroamphetamine  20 mg Oral BID AC  . Armodafinil  250 mg Oral Daily  . aspirin  81 mg Oral Daily  . bisoprolol  10 mg Oral Daily  . buPROPion  150 mg Oral Daily  . donepezil  5 mg Oral QHS  . enoxaparin (LOVENOX) injection  40 mg Subcutaneous Q24H  . feeding supplement  1 Container Oral Q24H  . fluconazole  100 mg Oral Daily  . lactose  free nutrition  237 mL Oral Q24H  . multivitamin with minerals  1 tablet Oral Daily  . pantoprazole  40 mg Oral BID AC  . sertraline  50 mg Oral Daily  . sodium chloride  3 mL Intravenous Q12H  . Sodium Oxybate  4,000 mg Oral Custom  . sucralfate  1 g Oral Q6H   Continuous:   Assessment/Plan: 1) Singultus. 2) Mild Candidal esophagitis. 3) Hiatal hernia.   He is improved and it may be as a result of the Candidal esophagitis treatment.  At this time, I do not believe his symptoms warrant further acute inpatient hospitalization.  Plan: 1) Follow up in 2-4 weeks.   LOS: 13 days   Chava Dulac D 07/25/2012, 1:36 PM

## 2012-07-25 NOTE — Progress Notes (Signed)
Patient discharge to [facility] , wife at bedside. FC retained per MD due to urinary retention. PIV removed no s/s if infiltration or swelling noted. Report given to [facility], Charity fundraiser.

## 2012-07-25 NOTE — Progress Notes (Signed)
Clinical Social Work Department CLINICAL SOCIAL WORK PLACEMENT NOTE 07/25/2012  Patient:  Kirk Townsend, Kirk Townsend  Account Number:  [id number] Admit date:  07/12/2012  Clinical Social Worker:  Doroteo Glassman  Date/time:  07/17/2012 01:14 PM  Clinical Social Work is seeking post-discharge placement for this patient at the following level of care:   SKILLED NURSING   (*CSW will update this form in Epic as items are completed)   07/17/2012  Patient/family provided with [facility] Department of Clinical Social Work's list of facilities offering this level of care within the geographic area requested by the patient (or if unable, by the patient's family).  07/17/2012  Patient/family informed of their freedom to choose among providers that offer the needed level of care, that participate in Medicare, Medicaid or managed care program needed by the patient, have an available bed and are willing to accept the patient.  07/17/2012  Patient/family informed of [facility]' ownership interest in [facility], as well as of the fact that they are under no obligation to receive care at this facility.  PASARR submitted to EDS on 07/25/2012 PASARR number received from EDS on 07/25/2012  FL2 transmitted to all facilities in geographic area requested by pt/family on  07/17/2012 FL2 transmitted to all facilities within larger geographic area on   Patient informed that his/her managed care company has contracts with or will negotiate with  certain facilities, including the following:     Patient/family informed of bed offers received:  07/25/2012 Patient chooses bed at [facility], [place] Physician recommends and patient chooses bed at    [facility] [place] on  07/25/2012 Patient to be transferred to facility by   The following physician request were entered in Epic:   Additional Comments:

## 2012-07-27 ENCOUNTER — Inpatient Hospital Stay (HOSPITAL_COMMUNITY)
Admission: EM | Admit: 2012-07-27 | Discharge: 2012-07-30 | DRG: 177 | Disposition: A | Discharge status: Skilled Nursing Facility | Payer: Medicare Other | Attending: Internal Medicine | Admitting: Internal Medicine

## 2012-07-27 ENCOUNTER — Emergency Department (HOSPITAL_COMMUNITY): Payer: Medicare Other

## 2012-07-27 ENCOUNTER — Encounter (HOSPITAL_COMMUNITY): Payer: Self-pay | Admitting: *Deleted

## 2012-07-27 DIAGNOSIS — E7800 Pure hypercholesterolemia, unspecified: Secondary | ICD-10-CM | POA: Diagnosis present

## 2012-07-27 DIAGNOSIS — R0609 Other forms of dyspnea: Secondary | ICD-10-CM | POA: Diagnosis not present

## 2012-07-27 DIAGNOSIS — R0602 Shortness of breath: Secondary | ICD-10-CM | POA: Diagnosis not present

## 2012-07-27 DIAGNOSIS — Z5189 Encounter for other specified aftercare: Secondary | ICD-10-CM | POA: Diagnosis not present

## 2012-07-27 DIAGNOSIS — Z87891 Personal history of nicotine dependence: Secondary | ICD-10-CM | POA: Diagnosis not present

## 2012-07-27 DIAGNOSIS — R0902 Hypoxemia: Secondary | ICD-10-CM | POA: Diagnosis present

## 2012-07-27 DIAGNOSIS — I2510 Atherosclerotic heart disease of native coronary artery without angina pectoris: Secondary | ICD-10-CM | POA: Diagnosis not present

## 2012-07-27 DIAGNOSIS — I509 Heart failure, unspecified: Secondary | ICD-10-CM | POA: Diagnosis not present

## 2012-07-27 DIAGNOSIS — R0989 Other specified symptoms and signs involving the circulatory and respiratory systems: Secondary | ICD-10-CM | POA: Diagnosis not present

## 2012-07-27 DIAGNOSIS — J189 Pneumonia, unspecified organism: Secondary | ICD-10-CM

## 2012-07-27 DIAGNOSIS — J690 Pneumonitis due to inhalation of food and vomit: Principal | ICD-10-CM | POA: Diagnosis present

## 2012-07-27 DIAGNOSIS — B3781 Candidal esophagitis: Secondary | ICD-10-CM | POA: Diagnosis present

## 2012-07-27 DIAGNOSIS — K2270 Barrett's esophagus without dysplasia: Secondary | ICD-10-CM | POA: Diagnosis present

## 2012-07-27 DIAGNOSIS — Z7982 Long term (current) use of aspirin: Secondary | ICD-10-CM | POA: Diagnosis not present

## 2012-07-27 DIAGNOSIS — G47419 Narcolepsy without cataplexy: Secondary | ICD-10-CM | POA: Diagnosis present

## 2012-07-27 DIAGNOSIS — R066 Hiccough: Secondary | ICD-10-CM

## 2012-07-27 DIAGNOSIS — K209 Esophagitis, unspecified without bleeding: Secondary | ICD-10-CM | POA: Diagnosis not present

## 2012-07-27 DIAGNOSIS — I10 Essential (primary) hypertension: Secondary | ICD-10-CM | POA: Diagnosis present

## 2012-07-27 DIAGNOSIS — F0390 Unspecified dementia without behavioral disturbance: Secondary | ICD-10-CM | POA: Diagnosis present

## 2012-07-27 DIAGNOSIS — J9600 Acute respiratory failure, unspecified whether with hypoxia or hypercapnia: Secondary | ICD-10-CM

## 2012-07-27 DIAGNOSIS — Z9861 Coronary angioplasty status: Secondary | ICD-10-CM

## 2012-07-27 DIAGNOSIS — J9601 Acute respiratory failure with hypoxia: Secondary | ICD-10-CM

## 2012-07-27 DIAGNOSIS — J811 Chronic pulmonary edema: Secondary | ICD-10-CM

## 2012-07-27 DIAGNOSIS — K1370 Unspecified lesions of oral mucosa: Secondary | ICD-10-CM | POA: Diagnosis present

## 2012-07-27 DIAGNOSIS — I252 Old myocardial infarction: Secondary | ICD-10-CM

## 2012-07-27 DIAGNOSIS — Z79899 Other long term (current) drug therapy: Secondary | ICD-10-CM | POA: Diagnosis not present

## 2012-07-27 DIAGNOSIS — I5023 Acute on chronic systolic (congestive) heart failure: Secondary | ICD-10-CM | POA: Diagnosis not present

## 2012-07-27 DIAGNOSIS — R112 Nausea with vomiting, unspecified: Secondary | ICD-10-CM | POA: Diagnosis not present

## 2012-07-27 DIAGNOSIS — N189 Chronic kidney disease, unspecified: Secondary | ICD-10-CM | POA: Diagnosis not present

## 2012-07-27 DIAGNOSIS — D72829 Elevated white blood cell count, unspecified: Secondary | ICD-10-CM | POA: Diagnosis present

## 2012-07-27 HISTORY — DX: Heart failure, unspecified: I50.9

## 2012-07-27 HISTORY — DX: Pneumonia, unspecified organism: J18.9

## 2012-07-27 LAB — POCT I-STAT 3, ART BLOOD GAS (G3+)
pCO2 arterial: 35.4 mmHg (ref 35.0–45.0)
pH, Arterial: 7.451 — ABNORMAL HIGH (ref 7.350–7.450)

## 2012-07-27 LAB — CBC WITH DIFFERENTIAL/PLATELET
Basophils Relative: 1 % (ref 0–1)
Eosinophils Absolute: 0.1 10*3/uL (ref 0.0–0.7)
HCT: 32 % — ABNORMAL LOW (ref 39.0–52.0)
Hemoglobin: 11.2 g/dL — ABNORMAL LOW (ref 13.0–17.0)
MCV: 93.3 fL (ref 78.0–100.0)
Monocytes Relative: 11 % (ref 3–12)
RBC: 3.43 MIL/uL — ABNORMAL LOW (ref 4.22–5.81)
RDW: 13.1 % (ref 11.5–15.5)
WBC: 14.6 10*3/uL — ABNORMAL HIGH (ref 4.0–10.5)

## 2012-07-27 LAB — CBC
MCHC: 34.6 g/dL (ref 30.0–36.0)
MCV: 93.9 fL (ref 78.0–100.0)
Platelets: 716 10*3/uL — ABNORMAL HIGH (ref 150–400)
RDW: 13.1 % (ref 11.5–15.5)
WBC: 13.6 10*3/uL — ABNORMAL HIGH (ref 4.0–10.5)

## 2012-07-27 LAB — COMPREHENSIVE METABOLIC PANEL
Albumin: 2.3 g/dL — ABNORMAL LOW (ref 3.5–5.2)
Alkaline Phosphatase: 119 U/L — ABNORMAL HIGH (ref 39–117)
BUN: 22 mg/dL (ref 6–23)
Potassium: 4 mEq/L (ref 3.5–5.1)
Sodium: 131 mEq/L — ABNORMAL LOW (ref 135–145)
Total Protein: 6.2 g/dL (ref 6.0–8.3)

## 2012-07-27 LAB — URINE MICROSCOPIC-ADD ON

## 2012-07-27 LAB — CREATININE, SERUM: GFR calc Af Amer: 66 mL/min — ABNORMAL LOW (ref >90)

## 2012-07-27 LAB — URINALYSIS, ROUTINE W REFLEX MICROSCOPIC
Bilirubin Urine: NEGATIVE
Leukocytes, UA: NEGATIVE
Nitrite: NEGATIVE
Specific Gravity, Urine: 1.013 (ref 1.005–1.030)
pH: 7.5 (ref 5.0–8.0)

## 2012-07-27 LAB — CG4 I-STAT (LACTIC ACID): Lactic Acid, Venous: 1.21 mmol/L (ref 0.5–2.2)

## 2012-07-27 LAB — PRO B NATRIURETIC PEPTIDE: Pro B Natriuretic peptide (BNP): 12393 pg/mL — ABNORMAL HIGH (ref 0–450)

## 2012-07-27 LAB — MRSA PCR SCREENING: MRSA by PCR: NEGATIVE

## 2012-07-27 MED ORDER — SIMVASTATIN 20 MG PO TABS
20.0000 mg | ORAL_TABLET | Freq: Every evening | ORAL | Status: DC
  Administered 2012-07-27 – 2012-07-29 (×3): 20 mg via ORAL
  Filled 2012-07-27 (×4): qty 1

## 2012-07-27 MED ORDER — PIPERACILLIN-TAZOBACTAM 3.375 G IVPB 30 MIN
3.3750 g | Freq: Once | INTRAVENOUS | Status: AC
  Administered 2012-07-27: 3.375 g via INTRAVENOUS
  Filled 2012-07-27: qty 50

## 2012-07-27 MED ORDER — SUCRALFATE 1 GM/10ML PO SUSP
1.0000 g | Freq: Four times a day (QID) | ORAL | Status: DC
  Administered 2012-07-28 – 2012-07-30 (×7): 1 g via ORAL
  Filled 2012-07-27 (×14): qty 10

## 2012-07-27 MED ORDER — DONEPEZIL HCL 5 MG PO TABS
5.0000 mg | ORAL_TABLET | Freq: Every day | ORAL | Status: DC
  Administered 2012-07-27 – 2012-07-29 (×3): 5 mg via ORAL
  Filled 2012-07-27 (×4): qty 1

## 2012-07-27 MED ORDER — BUPROPION HCL ER (XL) 150 MG PO TB24
150.0000 mg | ORAL_TABLET | Freq: Every day | ORAL | Status: DC
  Administered 2012-07-27 – 2012-07-30 (×4): 150 mg via ORAL
  Filled 2012-07-27 (×4): qty 1

## 2012-07-27 MED ORDER — PANTOPRAZOLE SODIUM 40 MG PO TBEC
40.0000 mg | DELAYED_RELEASE_TABLET | Freq: Two times a day (BID) | ORAL | Status: DC
  Administered 2012-07-28 (×2): 40 mg via ORAL
  Filled 2012-07-27 (×2): qty 1

## 2012-07-27 MED ORDER — AMPHETAMINE-DEXTROAMPHETAMINE 10 MG PO TABS
20.0000 mg | ORAL_TABLET | Freq: Two times a day (BID) | ORAL | Status: DC
  Administered 2012-07-27 – 2012-07-30 (×5): 20 mg via ORAL
  Filled 2012-07-27 (×5): qty 2

## 2012-07-27 MED ORDER — BOOST / RESOURCE BREEZE PO LIQD
1.0000 | ORAL | Status: DC
  Administered 2012-07-27 – 2012-07-29 (×2): 1 via ORAL

## 2012-07-27 MED ORDER — HEPARIN SODIUM (PORCINE) 5000 UNIT/ML IJ SOLN
5000.0000 [IU] | Freq: Three times a day (TID) | INTRAMUSCULAR | Status: DC
  Administered 2012-07-27 – 2012-07-30 (×8): 5000 [IU] via SUBCUTANEOUS
  Filled 2012-07-27 (×11): qty 1

## 2012-07-27 MED ORDER — SERTRALINE HCL 50 MG PO TABS
50.0000 mg | ORAL_TABLET | Freq: Every day | ORAL | Status: DC
  Administered 2012-07-27 – 2012-07-30 (×4): 50 mg via ORAL
  Filled 2012-07-27 (×4): qty 1

## 2012-07-27 MED ORDER — FUROSEMIDE 10 MG/ML IJ SOLN
40.0000 mg | Freq: Once | INTRAMUSCULAR | Status: AC
  Administered 2012-07-27: 40 mg via INTRAVENOUS
  Filled 2012-07-27: qty 4

## 2012-07-27 MED ORDER — SODIUM CHLORIDE 0.9 % IJ SOLN
3.0000 mL | Freq: Two times a day (BID) | INTRAMUSCULAR | Status: DC
  Administered 2012-07-29 (×2): 3 mL via INTRAVENOUS

## 2012-07-27 MED ORDER — ONDANSETRON HCL 4 MG/2ML IJ SOLN: 4.0000 mg | Freq: Four times a day (QID) | INTRAMUSCULAR | Status: DC | PRN

## 2012-07-27 MED ORDER — FUROSEMIDE 10 MG/ML IJ SOLN
40.0000 mg | Freq: Every day | INTRAMUSCULAR | Status: DC
  Administered 2012-07-27 – 2012-07-28 (×2): 40 mg via INTRAVENOUS
  Filled 2012-07-27 (×2): qty 4

## 2012-07-27 MED ORDER — TAMSULOSIN HCL 0.4 MG PO CAPS
0.4000 mg | ORAL_CAPSULE | Freq: Every day | ORAL | Status: DC
  Administered 2012-07-28 – 2012-07-29 (×2): 0.4 mg via ORAL
  Filled 2012-07-27 (×3): qty 1

## 2012-07-27 MED ORDER — ASPIRIN 81 MG PO CHEW
81.0000 mg | CHEWABLE_TABLET | Freq: Every day | ORAL | Status: DC
  Administered 2012-07-27 – 2012-07-30 (×4): 81 mg via ORAL
  Filled 2012-07-27 (×4): qty 1

## 2012-07-27 MED ORDER — VANCOMYCIN HCL IN DEXTROSE 1-5 GM/200ML-% IV SOLN
1000.0000 mg | Freq: Once | INTRAVENOUS | Status: AC
  Administered 2012-07-27: 1000 mg via INTRAVENOUS
  Filled 2012-07-27: qty 200

## 2012-07-27 MED ORDER — SODIUM CHLORIDE 0.9 % IJ SOLN
3.0000 mL | Freq: Two times a day (BID) | INTRAMUSCULAR | Status: DC
  Administered 2012-07-28: 3 mL via INTRAVENOUS

## 2012-07-27 MED ORDER — FLUCONAZOLE 100 MG PO TABS
100.0000 mg | ORAL_TABLET | Freq: Every day | ORAL | Status: DC
  Administered 2012-07-28 – 2012-07-30 (×3): 100 mg via ORAL
  Filled 2012-07-27 (×3): qty 1

## 2012-07-27 MED ORDER — CALCIUM CARBONATE 1250 (500 CA) MG PO TABS
1.0000 | ORAL_TABLET | Freq: Every day | ORAL | Status: DC
  Administered 2012-07-28 – 2012-07-30 (×2): 500 mg via ORAL
  Filled 2012-07-27 (×4): qty 1

## 2012-07-27 MED ORDER — ONDANSETRON HCL 4 MG PO TABS: 4.0000 mg | ORAL_TABLET | Freq: Four times a day (QID) | ORAL | Status: DC | PRN

## 2012-07-27 MED ORDER — SODIUM CHLORIDE 0.9 % IV SOLN: 250.0000 mL | INTRAVENOUS | Status: DC | PRN

## 2012-07-27 MED ORDER — ARMODAFINIL 250 MG PO TABS
250.0000 mg | ORAL_TABLET | Freq: Every day | ORAL | Status: DC
  Filled 2012-07-27: qty 1

## 2012-07-27 MED ORDER — VANCOMYCIN HCL IN DEXTROSE 1-5 GM/200ML-% IV SOLN
1000.0000 mg | INTRAVENOUS | Status: DC
  Administered 2012-07-28: 1000 mg via INTRAVENOUS
  Filled 2012-07-27 (×2): qty 200

## 2012-07-27 MED ORDER — SODIUM CHLORIDE 0.9 % IJ SOLN: 3.0000 mL | INTRAMUSCULAR | Status: DC | PRN

## 2012-07-27 MED ORDER — DOCUSATE SODIUM 100 MG PO CAPS
100.0000 mg | ORAL_CAPSULE | Freq: Two times a day (BID) | ORAL | Status: DC
  Administered 2012-07-27 – 2012-07-30 (×6): 100 mg via ORAL
  Filled 2012-07-27 (×7): qty 1

## 2012-07-27 MED ORDER — BISOPROLOL FUMARATE 10 MG PO TABS
10.0000 mg | ORAL_TABLET | Freq: Every day | ORAL | Status: DC
  Administered 2012-07-27 – 2012-07-30 (×4): 10 mg via ORAL
  Filled 2012-07-27 (×6): qty 1

## 2012-07-27 MED ORDER — PIPERACILLIN-TAZOBACTAM 3.375 G IVPB 30 MIN
3.3750 g | Freq: Three times a day (TID) | INTRAVENOUS | Status: DC
  Administered 2012-07-28 (×3): 3.375 g via INTRAVENOUS
  Filled 2012-07-27 (×5): qty 50

## 2012-07-27 MED ORDER — BOOST PLUS PO LIQD: 237.0000 mL | ORAL | Status: DC

## 2012-07-27 MED ORDER — MAGIC MOUTHWASH W/LIDOCAINE
15.0000 mL | Freq: Four times a day (QID) | ORAL | Status: DC
  Administered 2012-07-27 – 2012-07-30 (×9): 15 mL via ORAL
  Filled 2012-07-27 (×14): qty 15

## 2012-07-27 NOTE — ED Notes (Signed)
Reg diet ordered per Dr.Le

## 2012-07-27 NOTE — Progress Notes (Signed)
ANTIBIOTIC CONSULT NOTE - INITIAL  Pharmacy Consult for Vancomycin Indication: pneumonia  Allergies  Allergen Reactions  . Metoprolol Other (See Comments)    Stomach cramps, gas, diarrhea    Patient Measurements: Height: 5\' 7"  (170.2 cm) Weight: 144 lb 2.9 oz (65.4 kg) IBW/kg (Calculated) : 66.1  Vital Signs: Temp: 98.6 F (37 C) (04/02 2115) Temp src: Oral (04/02 2115) BP: 152/64 mmHg (04/02 2115) Pulse Rate: 74 (04/02 2115) Intake/Output from previous day:   Intake/Output from this shift:    Labs:  Recent Labs  07/25/12 0421 07/27/12 1800  WBC  --  14.6*  HGB  --  11.2*  PLT  --  740*  CREATININE 1.17 1.16   Estimated Creatinine Clearance: 45.4 ml/min (by C-G formula based on Cr of 1.16). No results found for this basename: VANCOTROUGH, VANCOPEAK, VANCORANDOM, GENTTROUGH, GENTPEAK, GENTRANDOM, TOBRATROUGH, TOBRAPEAK, TOBRARND, AMIKACINPEAK, AMIKACINTROU, AMIKACIN,  in the last 72 hours   Microbiology: Recent Results (from the past 720 hour(s))  CULTURE, BLOOD (ROUTINE X 2)     Status: None   Collection Time    07/16/12  7:40 AM      Result Value Range Status   Specimen Description BLOOD LEFT ARM   Final   Special Requests BOTTLES DRAWN AEROBIC AND ANAEROBIC 5CC   Final   Culture  Setup Time 07/16/2012 14:49   Final   Culture NO GROWTH 5 DAYS   Final   Report Status 07/22/2012 FINAL   Final  CULTURE, BLOOD (ROUTINE X 2)     Status: None   Collection Time    07/16/12  7:45 AM      Result Value Range Status   Specimen Description BLOOD RIGHT ARM   Final   Special Requests BOTTLES DRAWN AEROBIC AND ANAEROBIC 5CC   Final   Culture  Setup Time 07/16/2012 14:49   Final   Culture NO GROWTH 5 DAYS   Final   Report Status 07/22/2012 FINAL   Final    Medical History: Past Medical History  Diagnosis Date  . Coronary artery disease   . Hypertension   . Renal disorder   . High cholesterol   . Narcolepsy   . Myocardial infarction     1994  . Barrett's  esophagus   . CHF (congestive heart failure)   . CAP (community acquired pneumonia)     Medications:  Anti-infectives   Start     Dose/Rate Route Frequency Ordered Stop   07/27/12 2200  piperacillin-tazobactam (ZOSYN) IVPB 3.375 g     3.375 g 100 mL/hr over 30 Minutes Intravenous 3 times per day 07/27/12 2115     07/27/12 2115  fluconazole (DIFLUCAN) tablet 100 mg     100 mg Oral Daily 07/27/12 2114     07/27/12 1800  piperacillin-tazobactam (ZOSYN) IVPB 3.375 g     3.375 g 100 mL/hr over 30 Minutes Intravenous  Once 07/27/12 1754 07/27/12 1847   07/27/12 1800  vancomycin (VANCOCIN) IVPB 1000 mg/200 mL premix     1,000 mg 200 mL/hr over 60 Minutes Intravenous  Once 07/27/12 1754 07/27/12 2008     Assessment: 79 year old male admitted with suspected HCAP to continue antibiotic therapy with Vancomycin and Zosyn.    Goal of Therapy:  Vancomycin trough level 15-20 mcg/ml  Plan:  Vancomycin 1gm IV q24h Zosyn 3.375gm IV q8h extended infusion Monitor renal function  Estella Husk, Pharm.D., BCPS Clinical Pharmacist  Phone [phone] Pager [phone] 07/27/2012, 9:20 PM

## 2012-07-27 NOTE — ED Notes (Signed)
EDP at bedside. Pt taken off of CPAP and placed on 2L, tolerating well and 94-95%. Will continue to monitor.

## 2012-07-27 NOTE — H&P (Signed)
[facility] History and Physical  Kirk Townsend BJY:[id number] DOB: 12/14/1928    PCP:   Kirk Moh, MD   Chief Complaint: brought in for DOE and hypoxia at the nursing home.  HPI: Kirk Townsend is an 79 y.o. male with hx of narcolepsy on Nuvigil and Adderal, Dementia on aricept, CAD, CHF, Barrett's esosphagus, recently admitted for candida esosphagitis on Diflucan, discharged 07/25/12 to the NH, brought back into the ER because staff noticed he has increased work of breathing and pulox showed 86%.  En route, he was placed on Bipap, and had hypertensive response.  He was given IV Lasix and improved. ABG: 7.45/35/paO2=65 RA. CXR showed increased bilateral lobe infiltrates.  His Cr is normal.  He has a leukocytosis with WBC of 14.6K. No EKG was seen.  His troponin was negative.  Hospitalist was asked to admit him for HCAP and CHF.  Rewiew of Systems:  A ROS was not reliable due to his dementia, however, he denied CP.    Past Medical History  Diagnosis Date  . Coronary artery disease   . Hypertension   . Renal disorder   . High cholesterol   . Narcolepsy   . Myocardial infarction     1994  . Barrett's esophagus   . CHF (congestive heart failure)   . CAP (community acquired pneumonia)     Past Surgical History  Procedure Laterality Date  . Hernia repair    . Coronary angioplasty      1994. No stent.  . Esophagogastroduodenoscopy N/A 07/22/2012    Procedure: ESOPHAGOGASTRODUODENOSCOPY (EGD);  Surgeon: Theda Belfast, MD;  Location: [facility];  Service: Endoscopy;  Laterality: N/A;    Medications:  HOME MEDS: Prior to Admission medications   Medication Sig Start Date End Date Taking? Authorizing Provider  Alum & Mag Hydroxide-Simeth (MAGIC MOUTHWASH W/LIDOCAINE) SOLN Take 15 mLs by mouth 4 (four) times daily. tAKE FOR 10 DAYS THEN USE AS NEEDED 07/25/12  Yes Rodolph Bong, MD  amphetamine-dextroamphetamine (ADDERALL) 10 MG tablet Take 20 mg by mouth 2 (two)  times daily.    Yes Historical Provider, MD  Armodafinil (NUVIGIL) 250 MG tablet Take 250 mg by mouth daily.   Yes Historical Provider, MD  aspirin 81 MG chewable tablet Chew 81 mg by mouth daily.   Yes Historical Provider, MD  bisoprolol (ZEBETA) 10 MG tablet Take 1 tablet (10 mg total) by mouth daily. 07/25/12  Yes Rodolph Bong, MD  buPROPion (WELLBUTRIN XL) 150 MG 24 hr tablet Take 150 mg by mouth daily.   Yes Historical Provider, MD  calcium carbonate (OS-CAL - DOSED IN MG OF ELEMENTAL CALCIUM) 1250 MG tablet Take 1 tablet by mouth daily.   Yes Historical Provider, MD  chlorproMAZINE (THORAZINE) 50 MG tablet Take 1 tablet (50 mg total) by mouth 4 (four) times daily as needed. For hiccups 07/25/12  Yes Rodolph Bong, MD  donepezil (ARICEPT) 5 MG tablet Take 5 mg by mouth at bedtime.   Yes Historical Provider, MD  feeding supplement (RESOURCE BREEZE) LIQD Take 1 Container by mouth daily. 07/22/12  Yes Rodolph Bong, MD  fluconazole (DIFLUCAN) 100 MG tablet Take 1 tablet (100 mg total) by mouth daily. Take for 7 days. 07/25/12  Yes Rodolph Bong, MD  furosemide (LASIX) 20 MG tablet Take 1 tablet (20 mg total) by mouth daily. 07/25/12  Yes Rodolph Bong, MD  lactose free nutrition (BOOST PLUS) LIQD Take 237 mLs by mouth daily. 07/22/12  Yes Rodolph Bong, MD  Multiple Vitamin (MULITIVITAMIN WITH MINERALS) TABS Take 1 tablet by mouth daily.   Yes Historical Provider, MD  pantoprazole (PROTONIX) 40 MG tablet Take 1 tablet (40 mg total) by mouth 2 (two) times daily before a meal. 07/25/12  Yes Rodolph Bong, MD  promethazine (PHENERGAN) 25 MG tablet Take 1 tablet (25 mg total) by mouth every 6 (six) hours as needed for nausea. For hiccups 07/22/12  Yes Rodolph Bong, MD  sertraline (ZOLOFT) 50 MG tablet Take 50 mg by mouth daily.   Yes Historical Provider, MD  simvastatin (ZOCOR) 20 MG tablet Take 20 mg by mouth every evening.   Yes Historical Provider, MD  Sodium Oxybate  (XYREM PO) Take 4 g by mouth 2 (two) times daily.    Yes Historical Provider, MD  sucralfate (CARAFATE) 1 GM/10ML suspension Take 10 mLs (1 g total) by mouth every 6 (six) hours. tAKE FOR 10 DAYS THEN USE AS NEEDED. 07/25/12  Yes Rodolph Bong, MD  tamsulosin (FLOMAX) 0.4 MG CAPS Take 1 capsule (0.4 mg total) by mouth daily after supper. 07/25/12  Yes Rodolph Bong, MD  vitamin C (ASCORBIC ACID) 500 MG tablet Take 500 mg by mouth daily.   Yes Historical Provider, MD     Allergies:  Allergies  Allergen Reactions  . Metoprolol Other (See Comments)    Stomach cramps, gas, diarrhea    Social History:   reports that he quit smoking about 55 years ago. He has never used smokeless tobacco. He reports that he does not drink alcohol or use illicit drugs.  Family History: No family history on file.   Physical Exam: Filed Vitals:   07/27/12 1802 07/27/12 1811 07/27/12 1830 07/27/12 1849  BP:  141/72 143/67 138/66  Pulse:  71 70 70  Temp:    98.6 F (37 C)  TempSrc:    Oral  Resp:  24 20 21   SpO2: 93% 93% 94% 94%   Blood pressure 138/66, pulse 70, temperature 98.6 F (37 C), temperature source Oral, resp. rate 21, SpO2 94.00%.  GEN:  Pleasant  patient lying in the stretcher in no acute distress; cooperative with exam. PSYCH:  Confused. does not appear anxious or depressed; affect is appropriate. HEENT: Mucous membranes pink and anicteric; PERRLA; EOM intact; no cervical lymphadenopathy nor thyromegaly or carotid bruit; no JVD; There were no stridor. Neck is very supple. Breasts:: Not examined CHEST WALL: No tenderness CHEST: crackles throughout but no wheezing. HEART: Regular rate and rhythm.  There are no murmur, rub, or gallops.   BACK: No kyphosis or scoliosis; no CVA tenderness ABDOMEN: soft and non-tender; no masses, no organomegaly, normal abdominal bowel sounds; no pannus; no intertriginous candida. There is no rebound and no distention. Rectal Exam: Not  done EXTREMITIES: No bone or joint deformity; age-appropriate arthropathy of the hands and knees; 1+ edema; no ulcerations.  There is no calf tenderness. Genitalia: not examined PULSES: 2+ and symmetric SKIN: Normal hydration no rash or ulceration CNS: Cranial nerves 2-12 grossly intact no focal lateralizing neurologic deficit.  Speech is fluent; uvula elevated with phonation, facial symmetry and tongue midline. DTR are normal bilaterally, cerebella exam is intact, barbinski is negative and strengths are equaled bilaterally.  No sensory loss.   Labs on Admission:  Basic Metabolic Panel:  Recent Labs Lab 07/21/12 0455 07/22/12 0443 07/23/12 0517 07/25/12 0421 07/27/12 1800  NA 134* 134* 131* 132* 131*  K 4.0 3.9 4.4 4.1 4.0  CL  99 98 95* 97 97  CO2 24 25 23 25 24   GLUCOSE 116* 111* 107* 107* 122*  BUN 21 22 22 20 22   CREATININE 1.14 1.22 1.20 1.17 1.16  CALCIUM 8.7 8.7 8.9 8.5 9.0   Liver Function Tests:  Recent Labs Lab 07/27/12 1800  AST 50*  ALT 50  ALKPHOS 119*  BILITOT 0.3  PROT 6.2  ALBUMIN 2.3*   No results found for this basename: LIPASE, AMYLASE,  in the last 168 hours No results found for this basename: AMMONIA,  in the last 168 hours CBC:  Recent Labs Lab 07/27/12 1800  WBC 14.6*  NEUTROABS 12.2*  HGB 11.2*  HCT 32.0*  MCV 93.3  PLT 740*   Cardiac Enzymes:  Recent Labs Lab 07/27/12 1800  TROPONINI <0.30    CBG: No results found for this basename: GLUCAP,  in the last 168 hours   Radiological Exams on Admission: Dg Chest Port 1 View  07/27/2012  *RADIOLOGY REPORT*  Clinical Data: Shortness of breath  PORTABLE CHEST - 1 VIEW  Comparison: 07/15/2012  Findings: The cardiac shadow is stable.  There is worsening bilateral upper lobe airspace disease when compared with prior exam.  Old rib fractures are again noted on the left.  No sizable effusion is seen.  IMPRESSION: Increasing bilateral upper lobe infiltrates.   Original Report Authenticated By:  Alcide Clever, M.D.     Assessment/Plan Present on Admission:  . HCAP (healthcare-associated pneumonia) . CHF (congestive heart failure) . HTN (hypertension) . Acute esophagitis . Acute respiratory failure with hypoxia  PLAN:  Will admit him for HCAP and tx with IV Van/zosyn given in the ER.  He is also slightly volume overload as well and will change PO lasix to IV Lasix.  He is a little tachypneic, so I will place him in SDU for closer monitoring.  He is definitely improving rapidly with supplemental oxygen.  I have continued his recent discharge medication.  He needs an EKG and I will cycle his troponin just to be prudent.  Thank you for allowing me to partake in the care of your nice patient.  Other plans as per orders.  Code Status: FULL Kirk Lightning, MD. [facility] Pager [phone] 7pm to 7am.  07/27/2012, 8:09 PM

## 2012-07-27 NOTE — ED Provider Notes (Signed)
History     CSN: [id number]  Arrival date & time 07/27/12  1723   First MD Initiated Contact with Patient 07/27/12 1726      Chief Complaint  Patient presents with  . Shortness of Breath    (Consider location/radiation/quality/duration/timing/severity/associated sxs/prior treatment) HPI Pt recently d/c to NH for CAP, espophgitis and CHF had inccreased SOb after lunch today with O2 sats in 80's. EMS called. Pt in mild resp distress and started on CPAP. Pt reportedly had temp of 101 in NH and was given tylenol. Pt admits to productive cough and SOB. Denies CP, abd pain, NV/D. No lower ext swelling.  Past Medical History  Diagnosis Date  . Coronary artery disease   . Hypertension   . Renal disorder   . High cholesterol   . Narcolepsy   . Myocardial infarction     1994  . Barrett's esophagus   . CHF (congestive heart failure)   . CAP (community acquired pneumonia)     Past Surgical History  Procedure Laterality Date  . Hernia repair    . Coronary angioplasty      1994. No stent.  . Esophagogastroduodenoscopy N/A 07/22/2012    Procedure: ESOPHAGOGASTRODUODENOSCOPY (EGD);  Surgeon: Theda Belfast, MD;  Location: [facility];  Service: Endoscopy;  Laterality: N/A;    No family history on file.  History  Substance Use Topics  . Smoking status: Former Smoker -- 20 years    Quit date: 08/31/1956  . Smokeless tobacco: Never Used  . Alcohol Use: No      Review of Systems  Constitutional: Positive for fever.  Respiratory: Positive for shortness of breath. Negative for wheezing.   Cardiovascular: Negative for chest pain, palpitations and leg swelling.  Gastrointestinal: Negative for nausea, vomiting and abdominal pain.  Skin: Negative for rash and wound.  Neurological: Negative for weakness, light-headedness, numbness and headaches.  All other systems reviewed and are negative.    Allergies  Metoprolol  Home Medications   Current Outpatient Rx  Name  Route  Sig   Dispense  Refill  . Alum & Mag Hydroxide-Simeth (MAGIC MOUTHWASH W/LIDOCAINE) SOLN   Oral   Take 15 mLs by mouth 4 (four) times daily. tAKE FOR 10 DAYS THEN USE AS NEEDED   600 mL   0   . amphetamine-dextroamphetamine (ADDERALL) 10 MG tablet   Oral   Take 20 mg by mouth 2 (two) times daily.          . Armodafinil (NUVIGIL) 250 MG tablet   Oral   Take 250 mg by mouth daily.         Marland Kitchen aspirin 81 MG chewable tablet   Oral   Chew 81 mg by mouth daily.         . bisoprolol (ZEBETA) 10 MG tablet   Oral   Take 1 tablet (10 mg total) by mouth daily.   31 tablet   0   . buPROPion (WELLBUTRIN XL) 150 MG 24 hr tablet   Oral   Take 150 mg by mouth daily.         . calcium carbonate (OS-CAL - DOSED IN MG OF ELEMENTAL CALCIUM) 1250 MG tablet   Oral   Take 1 tablet by mouth daily.         . chlorproMAZINE (THORAZINE) 50 MG tablet   Oral   Take 1 tablet (50 mg total) by mouth 4 (four) times daily as needed. For hiccups   30 tablet   0   .  donepezil (ARICEPT) 5 MG tablet   Oral   Take 5 mg by mouth at bedtime.         . feeding supplement (RESOURCE BREEZE) LIQD   Oral   Take 1 Container by mouth daily.         . fluconazole (DIFLUCAN) 100 MG tablet   Oral   Take 1 tablet (100 mg total) by mouth daily. Take for 7 days.   7 tablet   0   . furosemide (LASIX) 20 MG tablet   Oral   Take 1 tablet (20 mg total) by mouth daily.   30 tablet   0   . lactose free nutrition (BOOST PLUS) LIQD   Oral   Take 237 mLs by mouth daily.         . Multiple Vitamin (MULITIVITAMIN WITH MINERALS) TABS   Oral   Take 1 tablet by mouth daily.         . pantoprazole (PROTONIX) 40 MG tablet   Oral   Take 1 tablet (40 mg total) by mouth 2 (two) times daily before a meal.   62 tablet   0   . promethazine (PHENERGAN) 25 MG tablet   Oral   Take 1 tablet (25 mg total) by mouth every 6 (six) hours as needed for nausea. For hiccups   30 tablet      . sertraline (ZOLOFT) 50  MG tablet   Oral   Take 50 mg by mouth daily.         . simvastatin (ZOCOR) 20 MG tablet   Oral   Take 20 mg by mouth every evening.         . Sodium Oxybate (XYREM PO)   Oral   Take 4 g by mouth 2 (two) times daily.          . sucralfate (CARAFATE) 1 GM/10ML suspension   Oral   Take 10 mLs (1 g total) by mouth every 6 (six) hours. tAKE FOR 10 DAYS THEN USE AS NEEDED.   420 mL   0   . tamsulosin (FLOMAX) 0.4 MG CAPS   Oral   Take 1 capsule (0.4 mg total) by mouth daily after supper.   30 capsule   0   . vitamin C (ASCORBIC ACID) 500 MG tablet   Oral   Take 500 mg by mouth daily.           BP 138/66  Pulse 70  Temp(Src) 98.6 F (37 C) (Oral)  Resp 21  SpO2 94%  Physical Exam  Nursing note and vitals reviewed. Constitutional: He is oriented to person, place, and time. He appears well-developed and well-nourished. No distress.  HENT:  Head: Normocephalic and atraumatic.  Mouth/Throat: Oropharynx is clear and moist.  Eyes: EOM are normal. Pupils are equal, round, and reactive to light.  Neck: Normal range of motion. Neck supple.  Cardiovascular: Normal rate and regular rhythm.   Pulmonary/Chest: No respiratory distress. He has no wheezes. He has no rales.  Increased work of breathing. Crackles throughout.   Abdominal: Soft. Bowel sounds are normal. He exhibits no mass. There is no tenderness. There is no rebound and no guarding.  Musculoskeletal: Normal range of motion. He exhibits no edema and no tenderness.  Neurological: He is alert and oriented to person, place, and time.  Moves all ext, sensation intact  Skin: Skin is warm and dry. No rash noted. No erythema.  Psychiatric: He has a normal mood and affect. His behavior  is normal.    ED Course  Procedures (including critical care time)  Labs Reviewed  CBC WITH DIFFERENTIAL - Abnormal; Notable for the following:    WBC 14.6 (*)    RBC 3.43 (*)    Hemoglobin 11.2 (*)    HCT 32.0 (*)    Platelets  740 (*)    Neutrophils Relative 84 (*)    Neutro Abs 12.2 (*)    Lymphocytes Relative 4 (*)    Lymphs Abs 0.6 (*)    Monocytes Absolute 1.6 (*)    All other components within normal limits  COMPREHENSIVE METABOLIC PANEL - Abnormal; Notable for the following:    Sodium 131 (*)    Glucose, Bld 122 (*)    Albumin 2.3 (*)    AST 50 (*)    Alkaline Phosphatase 119 (*)    GFR calc non Af Amer 57 (*)    GFR calc Af Amer 66 (*)    All other components within normal limits  PRO B NATRIURETIC PEPTIDE - Abnormal; Notable for the following:    Pro B Natriuretic peptide (BNP) 12393.0 (*)    All other components within normal limits  POCT I-STAT 3, BLOOD GAS (G3+) - Abnormal; Notable for the following:    pH, Arterial 7.451 (*)    pO2, Arterial 63.0 (*)    Bicarbonate 24.7 (*)    All other components within normal limits  URINE CULTURE  CULTURE, BLOOD (ROUTINE X 2)  CULTURE, BLOOD (ROUTINE X 2)  TROPONIN I  URINALYSIS, ROUTINE W REFLEX MICROSCOPIC  CG4 I-STAT (LACTIC ACID)   Dg Chest Port 1 View  07/27/2012  *RADIOLOGY REPORT*  Clinical Data: Shortness of breath  PORTABLE CHEST - 1 VIEW  Comparison: 07/15/2012  Findings: The cardiac shadow is stable.  There is worsening bilateral upper lobe airspace disease when compared with prior exam.  Old rib fractures are again noted on the left.  No sizable effusion is seen.  IMPRESSION: Increasing bilateral upper lobe infiltrates.   Original Report Authenticated By: Alcide Clever, M.D.      1. Healthcare-associated pneumonia   2. Pulmonary edema      Date: 07/27/2012  Rate: 76  Rhythm: normal sinus rhythm  QRS Axis: normal  Intervals: normal  ST/T Wave abnormalities: ST depressions laterally  Conduction Disutrbances:none  Narrative Interpretation:   Old EKG Reviewed: unchanged    MDM   Discussed with Dr Conley Rolls. Will see in ED and admit       Loren Racer, MD 07/27/12 1931

## 2012-07-27 NOTE — ED Notes (Signed)
No edema noted to lower extremities. Denies CP.

## 2012-07-27 NOTE — ED Notes (Signed)
EMS-pt is from [facility]. Staff states that pt woke up this am and had no complaints, after lunch pt reports sob. O2 sats found to be 88% on room air, pt does not wear oxygen. Pt with hx of chf. Staff reports pt had fever of 101 and tylenol given. On EMS arrival pt with expiratory wheezing and rhales hears to lower lobes. Pt placed on CPAP. O2 sats 97%. All vitals wnl.

## 2012-07-28 DIAGNOSIS — I5023 Acute on chronic systolic (congestive) heart failure: Secondary | ICD-10-CM

## 2012-07-28 DIAGNOSIS — K209 Esophagitis, unspecified without bleeding: Secondary | ICD-10-CM

## 2012-07-28 LAB — URINE CULTURE

## 2012-07-28 LAB — TROPONIN I: Troponin I: <0.3 ng/mL (ref <0.30)

## 2012-07-28 MED ORDER — FUROSEMIDE 20 MG PO TABS
20.0000 mg | ORAL_TABLET | Freq: Every day | ORAL | Status: DC
  Administered 2012-07-28 – 2012-07-30 (×3): 20 mg via ORAL
  Filled 2012-07-28 (×3): qty 1

## 2012-07-28 MED ORDER — SODIUM OXYBATE 500 MG/ML PO SOLN: 3000.0000 mg | Freq: Two times a day (BID) | ORAL | Status: DC

## 2012-07-28 MED ORDER — ARMODAFINIL 250 MG PO TABS: 250.0000 mg | ORAL_TABLET | Freq: Every day | ORAL | Status: DC

## 2012-07-28 MED ORDER — MODAFINIL 200 MG PO TABS
200.0000 mg | ORAL_TABLET | Freq: Every day | ORAL | Status: DC
  Administered 2012-07-28 – 2012-07-30 (×3): 200 mg via ORAL
  Filled 2012-07-28 (×3): qty 1

## 2012-07-28 MED ORDER — PIPERACILLIN-TAZOBACTAM 3.375 G IVPB
3.3750 g | Freq: Three times a day (TID) | INTRAVENOUS | Status: DC
  Administered 2012-07-28 – 2012-07-29 (×2): 3.375 g via INTRAVENOUS
  Filled 2012-07-28 (×3): qty 50

## 2012-07-28 MED ORDER — SODIUM OXYBATE 500 MG/ML PO SOLN
3000.0000 mg | ORAL | Status: DC
  Administered 2012-07-28 – 2012-07-30 (×3): 3000 mg via ORAL
  Filled 2012-07-28 (×3): qty 6

## 2012-07-28 NOTE — Progress Notes (Signed)
Received order to get speech to evaluate patients swallow. Will hold on diet and po meds until recommendations from speech.

## 2012-07-28 NOTE — Progress Notes (Signed)
Utilization review completed.  

## 2012-07-28 NOTE — Progress Notes (Signed)
Kirk Townsend is a 79 y.o. male patient who transferred  from 10 with pulmonary edema and aspiration PNA awake, alert  & orientated  X 3, Prior, VSS - Blood pressure 160/71, pulse 70, temperature 98.5 F (36.9 C), temperature source Oral, resp. rate 23, height 5\' 7"  (1.702 m), weight 65.4 kg (144 lb 2.9 oz), SpO2 91.00%., no c/o shortness of breath, no c/o chest pain, no distress noted. Tele # [id number] placed and pt is currently running:normal sinus rhythm.   IV site WDL: forearm left, condition patent and no redness with a transparent dsg that's clean dry and intact.  Allergies:   Allergies  Allergen Reactions  . Metoprolol Other (See Comments)    Stomach cramps, gas, diarrhea     Past Medical History  Diagnosis Date  . Coronary artery disease   . Hypertension   . Renal disorder   . High cholesterol   . Narcolepsy   . Myocardial infarction     1994  . Barrett's esophagus   . CHF (congestive heart failure)   . CAP (community acquired pneumonia)     Pt orientation to unit, room and routine. SR up x 2, fall risk assessment complete with Patient and family verbalizing understanding of risks associated with falls. Pt verbalizes an understanding of how to use the call bell and to call for help before getting out of bed.  Skin, clean-dry- intact without evidence of bruising, or skin tears.   No evidence of skin break down noted on exam.     Will cont to monitor and assist as needed.  Cindra Eves, RN 07/28/2012 7:54 PM

## 2012-07-28 NOTE — Progress Notes (Addendum)
[facility] Progress Note [place] TEAM 1 - Stepdown/ICU TEAM   JEVAUGHN DEGOLLADO ION:[id number] DOB: 1929-01-15 DOA: 07/27/2012 PCP: Londell Moh, MD  Brief narrative: Kirk Townsend is an 79 y.o. male with hx of narcolepsy on Nuvigil and Adderal, Dementia on aricept, CAD, CHF, Barrett's esosphagus, recently admitted for candida esosphagitis on Diflucan, discharged 07/25/12 to the NH, brought back into the ER because staff noticed he has increased work of breathing and pulox showed 86%. En route, he was placed on Bipap, and had hypertensive response. He was given IV Lasix and improved. ABG: 7.45/35/paO2=65 RA. CXR showed increased bilateral lobe infiltrates. His Cr is normal. He has a leukocytosis with WBC of 14.6K. No EKG was seen. His troponin was negative. Hospitalist was asked to admit him for HCAP and CHF.   Assessment/Plan: Principal Problem:   Acute respiratory failure with hypoxia (A) HCAP (healthcare-associated pneumonia) - b/l upper lobe - cont current antibiotics - MBS in AM for silent aspiration   (B) Acute on chronic systolic CHF (congestive heart failure) - has diuresed about 4 L - will transition back to home dose of lasix- 20 mg daily  Active Problems:   HTN (hypertension) -cont home meds    Acute esophagitis - EGD during recent admission for "hiccoughs"  -cont protonix and sucralfate  Ulcers on roof of mouth -per wife dentures were left in during EGD resulting in these ulcers -cont dukes mouthwash and Diflucan  Dementia with acute delirium - not severe but will request a sitter for tonight    Code Status: full code (not discussed with family today) Family Communication: spoke with wife Disposition Plan: transfer out of stepdown to med/surg  Consultants: none  Procedures: none  Antibiotics: Vanc/Zosyn 4/2 Diflucan- 7 day course- today last day  DVT prophylaxis: Heparin  HPI/Subjective: Pt alert, has a dry cough, no dyspnea or chest  pain. Per wife his is confused- mild dementia at baseline.   Objective: Blood pressure 160/71, pulse 70, temperature 98.5 F (36.9 C), temperature source Oral, resp. rate 23, height 5\' 7"  (1.702 m), weight 65.4 kg (144 lb 2.9 oz), SpO2 91.00%.  Intake/Output Summary (Last 24 hours) at 07/28/12 1944 Last data filed at 07/28/12 1707  Gross per 24 hour  Intake    303 ml  Output   4600 ml  Net  -4297 ml     Exam: General: mildly restless- playing with mitts on hands- otherwise quite pleasant and communicative No acute respiratory distress Lungs: Clear to auscultation bilaterally without wheezes or crackles- off O2 wit pulse ox 94% on room air Cardiovascular: Regular rate and rhythm without murmur gallop or rub normal S1 and S2 Abdomen: Nontender, nondistended, soft, bowel sounds positive, no rebound, no ascites, no appreciable mass Extremities: No significant cyanosis, clubbing, or edema bilateral lower extremities  Data Reviewed: Basic Metabolic Panel:  Recent Labs Lab 07/22/12 0443 07/23/12 0517 07/25/12 0421 07/27/12 1800 07/27/12 2122  NA 134* 131* 132* 131*  --   K 3.9 4.4 4.1 4.0  --   CL 98 95* 97 97  --   CO2 25 23 25 24   --   GLUCOSE 111* 107* 107* 122*  --   BUN 22 22 20 22   --   CREATININE 1.22 1.20 1.17 1.16 1.16  CALCIUM 8.7 8.9 8.5 9.0  --    Liver Function Tests:  Recent Labs Lab 07/27/12 1800  AST 50*  ALT 50  ALKPHOS 119*  BILITOT 0.3  PROT 6.2  ALBUMIN 2.3*  No results found for this basename: LIPASE, AMYLASE,  in the last 168 hours No results found for this basename: AMMONIA,  in the last 168 hours CBC:  Recent Labs Lab 07/27/12 1800 07/27/12 2122  WBC 14.6* 13.6*  NEUTROABS 12.2*  --   HGB 11.2* 11.1*  HCT 32.0* 32.1*  MCV 93.3 93.9  PLT 740* 716*   Cardiac Enzymes:  Recent Labs Lab 07/27/12 1800 07/27/12 2122 07/28/12 0326 07/28/12 1021  TROPONINI <0.30 <0.30 <0.30 <0.30   BNP (last 3 results)  Recent Labs   09/01/11 1500 07/27/12 1800  PROBNP 2960.0* 12393.0*   CBG: No results found for this basename: GLUCAP,  in the last 168 hours  Recent Results (from the past 240 hour(s))  MRSA PCR SCREENING     Status: None   Collection Time    07/27/12  9:46 PM      Result Value Range Status   MRSA by PCR NEGATIVE  NEGATIVE Final   Comment:            The GeneXpert MRSA Assay (FDA     approved for NASAL specimens     only), is one component of a     comprehensive MRSA colonization     surveillance program. It is not     intended to diagnose MRSA     infection nor to guide or     monitor treatment for     MRSA infections.     Studies:  Recent x-ray studies have been reviewed in detail by the Attending Physician  Scheduled Meds:  Scheduled Meds: . amphetamine-dextroamphetamine  20 mg Oral BID  . Armodafinil  250 mg Oral Daily  . aspirin  81 mg Oral Daily  . bisoprolol  10 mg Oral Daily  . buPROPion  150 mg Oral Daily  . calcium carbonate  1 tablet Oral Q breakfast  . docusate sodium  100 mg Oral BID  . donepezil  5 mg Oral QHS  . feeding supplement  1 Container Oral Q24H  . fluconazole  100 mg Oral Daily  . furosemide  40 mg Intravenous Daily  . heparin  5,000 Units Subcutaneous Q8H  . magic mouthwash w/lidocaine  15 mL Oral QID  . modafinil  200 mg Oral Daily  . pantoprazole  40 mg Oral BID AC  . piperacillin-tazobactam  3.375 g Intravenous Q8H  . sertraline  50 mg Oral Daily  . simvastatin  20 mg Oral QPM  . sodium chloride  3 mL Intravenous Q12H  . sodium chloride  3 mL Intravenous Q12H  . Sodium Oxybate  3,000 mg Oral Custom  . sucralfate  1 g Oral Q6H  . tamsulosin  0.4 mg Oral QPC supper  . vancomycin  1,000 mg Intravenous Q24H   Continuous Infusions:   Time spent on care of this patient: 45 min   [facility]  [facility] Office  [phone] Pager - Text Page per Loretha Stapler as per below:  On-Call/Text Page:      [email or web address]      password [email or web address]  If  7PM-7AM, please contact night-coverage [email or web address] Password [facility] 07/28/2012, 7:44 PM   LOS: 1 day

## 2012-07-28 NOTE — Progress Notes (Signed)
ANTIBIOTIC CONSULT NOTE - INITIAL  Pharmacy Consult for Zosyn Indication: pneumonia  Patient Measurements: Height: 5\' 7"  (170.2 cm) Weight: 144 lb 2.9 oz (65.4 kg) IBW/kg (Calculated) : 66.1  Vital Signs: Temp: 98.5 F (36.9 C) (04/03 1700) Temp src: Oral (04/03 1700) BP: 160/71 mmHg (04/03 1700) Pulse Rate: 70 (04/03 1700)   Recent Labs  07/27/12 1800 07/27/12 2122  WBC 14.6* 13.6*  HGB 11.2* 11.1*  PLT 740* 716*  CREATININE 1.16 1.16   Estimated Creatinine Clearance: 45.4 ml/min (by C-G formula based on Cr of 1.16).  Assessment: 84yoF being continued on Vancomycin and Zosyn for HCAP. Pharmacy asked to manage Zosyn dosing. Current order for zosyn is appropriate. WBC 13.6, Scr 1.16, Afebrile, blood cultures sent.  Antibiotics Zosyn 4/2 >> Vancomycin 3/22-3/24,  4/2>> Levaquin 3/22- 3/29 Cefepime 3/22- 3/24  Plan:  Continue Zosyn 3.375g Q8 hours (extended infusion) F/u renal function, clinical status, cultures, LOT    Benjaman Pott, PharmD, BCPS 07/28/2012   8:03 PM

## 2012-07-28 NOTE — Clinical Social Work Psychosocial (Signed)
     Clinical Social Work Department BRIEF PSYCHOSOCIAL ASSESSMENT 07/28/2012  Patient:  Kirk Townsend, Kirk Townsend     Account Number:  [id number]     Admit date:  07/27/2012  Clinical Social Worker:  Lourdes Sledge  Date/Time:  07/28/2012 03:45 PM  Referred by:  RN  Date Referred:  07/28/2012 Referred for  SNF Placement  SNF Placement   Other Referral:   Interview type:  Patient Other interview type:   CSW also completed assessment with pt wife Chilton Greathouse [phone]    PSYCHOSOCIAL DATA Living Status:  FACILITY Admitted from facility:  [facility] Level of care:  Skilled Nursing Facility Primary support name:  Chilton Greathouse [phone] Primary support relationship to patient:  SPOUSE Degree of support available:   Pt spouse appears actively involved in pt care.    CURRENT CONCERNS Current Concerns  Post-Acute Placement   Other Concerns:    SOCIAL WORK ASSESSMENT / PLAN CSW informed that pt was admitted from [facility].    CSW visited pt room and introduced herself and role. Pt currently on restraints however able to answer CSW questions. Pt spouse also present and confirmed that pt was at [facility] for a few days prior to being admitted. Wife confirms pt is to return at discharge.    CSW has left a message for [facility] to confirm pt can return at discharge.   Assessment/plan status:   Other assessment/ plan:   Information/referral to community resources:   No resources needed at this time.    PATIENTS/FAMILYS RESPONSE TO PLAN OF CARE: Pt laying in bed with restraints however is alert and mental status might be questionable. Pt wife confirmed that pt is to return to [facility] at discharge.

## 2012-07-28 NOTE — Evaluation (Signed)
Clinical/Bedside Swallow Evaluation Patient Details  Name: Kirk Townsend MRN: [id number] Date of Birth: June 09, 1928  Today's Date: 07/28/2012 Time: 1478-2956 SLP Time Calculation (min): 11 min  Past Medical History:  Past Medical History  Diagnosis Date  . Coronary artery disease   . Hypertension   . Renal disorder   . High cholesterol   . Narcolepsy   . Myocardial infarction     1994  . Barrett's esophagus   . CHF (congestive heart failure)   . CAP (community acquired pneumonia)    Past Surgical History:  Past Surgical History  Procedure Laterality Date  . Hernia repair    . Coronary angioplasty      1994. No stent.  . Esophagogastroduodenoscopy N/A 07/22/2012    Procedure: ESOPHAGOGASTRODUODENOSCOPY (EGD);  Surgeon: Theda Belfast, MD;  Location: [facility];  Service: Endoscopy;  Laterality: N/A;   HPI:  Kirk Townsend is an 79 y.o. male with hx of narcolepsy on Nuvigil and Adderal, Dementia on aricept, CAD, CHF, Barrett's esosphagus, recently admitted for candida esosphagitis on Diflucan, discharged 07/25/12 to the NH, brought back into the ER because staff noticed he has increased work of breathing and pulox showed 86%.  Pt apparently developed respiratory distress after meal; CXR demonstrates upper lobe infiltrates.  DX is HCAP and CHF. Pt just d/c'd from hospital on 3/31. Seen by SLP on 3/20, demonstrated adequate swallow function though he reported shortness of breath occasionally with PO intake.    Assessment / Plan / Recommendation Clinical Impression  Pt presents with functional oropharyngeal swallow without evidence of aspiration. If aspiration is high on differential dx, pt would need MBS to objectively evaluate pts swallow function. Recommend pt initate a dys 3 diet (due to missing dentition), thin liquids, pills whole in puree, full supervision. Will defer to MD, if objective test warranted, please order MBS, otherwise SLP will sign off.     Aspiration Risk  Mild     Diet Recommendation Dysphagia 3 (Mechanical Soft);Thin liquid   Liquid Administration via: Cup Medication Administration: Whole meds with puree Supervision: Patient able to self feed;Full supervision/cueing for compensatory strategies Compensations: Slow rate;Small sips/bites Postural Changes and/or Swallow Maneuvers: Seated upright 90 degrees;Upright 30-60 min after meal    Other  Recommendations Recommended Consults: MBS Oral Care Recommendations: Oral care BID   Follow Up Recommendations  None    Frequency and Duration        Pertinent Vitals/Pain NA    SLP Swallow Goals     Swallow Study Prior Functional Status       General HPI: Kirk Townsend is an 79 y.o. male with hx of narcolepsy on Nuvigil and Adderal, Dementia on aricept, CAD, CHF, Barrett's esosphagus, recently admitted for candida esosphagitis on Diflucan, discharged 07/25/12 to the NH, brought back into the ER because staff noticed he has increased work of breathing and pulox showed 86%.  Pt apparently developed respiratory distress after meal and CXR concerning for asrpiation pna. Pt just d/c'd from hospital on 3/31. Seen by SLP on 3/20, demonstrated adequate swallow function though he reported shortness of breath occasionally with PO intake.  Type of Study: Bedside swallow evaluation Previous Swallow Assessment: BSE 3/20 Diet Prior to this Study: NPO Temperature Spikes Noted: No Respiratory Status: Room air History of Recent Intubation: No Behavior/Cognition: Alert;Cooperative;Pleasant mood;Confused Oral Cavity - Dentition: Dentures, not available (botom dentition, not present on bottom) Self-Feeding Abilities: Able to feed self Patient Positioning: Upright in bed Baseline Vocal Quality: Clear Volitional Cough:  Strong Volitional Swallow: Able to elicit    Oral/Motor/Sensory Function Overall Oral Motor/Sensory Function: Appears within functional limits for tasks assessed   Ice Chips     Thin Liquid Thin  Liquid: Within functional limits    Nectar Thick Nectar Thick Liquid: Not tested   Honey Thick Honey Thick Liquid: Not tested   Puree Puree: Within functional limits   Solid   GO    Solid: Impaired Presentation: Self Fed Oral Phase Functional Implications: Other (comment) (difficutl mastication due to missing dentition)      Harlon Ditty, MA CCC-SLP [phone]  Claudine Mouton 07/28/2012,4:16 PM

## 2012-07-28 NOTE — Progress Notes (Signed)
Report called to Diane, receiving RN on  5500. VSS. Transferred to 5531 via bed with personal belongings. Called patient's family member with new room number.  [facility]

## 2012-07-29 ENCOUNTER — Inpatient Hospital Stay (HOSPITAL_COMMUNITY): Payer: Medicare Other

## 2012-07-29 DIAGNOSIS — I10 Essential (primary) hypertension: Secondary | ICD-10-CM

## 2012-07-29 DIAGNOSIS — N189 Chronic kidney disease, unspecified: Secondary | ICD-10-CM

## 2012-07-29 LAB — CBC
MCHC: 35.3 g/dL (ref 30.0–36.0)
RDW: 12.9 % (ref 11.5–15.5)

## 2012-07-29 LAB — BASIC METABOLIC PANEL
BUN: 22 mg/dL (ref 6–23)
Calcium: 9.2 mg/dL (ref 8.4–10.5)
Creatinine, Ser: 1.38 mg/dL — ABNORMAL HIGH (ref 0.50–1.35)
GFR calc Af Amer: 53 mL/min — ABNORMAL LOW (ref >90)
GFR calc non Af Amer: 46 mL/min — ABNORMAL LOW (ref >90)
Potassium: 3.4 mEq/L — ABNORMAL LOW (ref 3.5–5.1)

## 2012-07-29 LAB — PRO B NATRIURETIC PEPTIDE: Pro B Natriuretic peptide (BNP): 9314 pg/mL — ABNORMAL HIGH (ref 0–450)

## 2012-07-29 MED ORDER — LIVING BETTER WITH HEART FAILURE BOOK
Freq: Once | Status: AC
  Administered 2012-07-29: 13:00:00
  Filled 2012-07-29: qty 1

## 2012-07-29 MED ORDER — PANTOPRAZOLE SODIUM 40 MG PO TBEC
40.0000 mg | DELAYED_RELEASE_TABLET | Freq: Every day | ORAL | Status: DC
  Administered 2012-07-29 – 2012-07-30 (×2): 40 mg via ORAL
  Filled 2012-07-29 (×2): qty 1

## 2012-07-29 MED ORDER — SODIUM CHLORIDE 0.9 % IV SOLN
3.0000 g | Freq: Four times a day (QID) | INTRAVENOUS | Status: DC
  Administered 2012-07-29 – 2012-07-30 (×3): 3 g via INTRAVENOUS
  Filled 2012-07-29 (×7): qty 3

## 2012-07-29 NOTE — Progress Notes (Signed)
-   I agree with plan as above. - MBBS pending. - Change antibiotics to Unasyn. - Cont. diflucan.

## 2012-07-29 NOTE — Progress Notes (Signed)
ANTIBIOTIC CONSULT NOTE - INITIAL  Pharmacy Consult for Unasyn Indication: pneumonia  Allergies  Allergen Reactions  . Metoprolol Other (See Comments)    Stomach cramps, gas, diarrhea    Patient Measurements: Height: 5\' 7"  (170.2 cm) Weight: 144 lb 2.9 oz (65.4 kg) IBW/kg (Calculated) : 66.1 Adjusted Body Weight:   Vital Signs: Temp: 98.7 F (37.1 C) (04/04 0536) Temp src: Oral (04/04 0536) BP: 102/84 mmHg (04/04 0536) Pulse Rate: 77 (04/04 0536) Intake/Output from previous day: 04/03 0701 - 04/04 0700 In: 303 [I.V.:3; IV Piggyback:300] Out: 2875 [Urine:2875] Intake/Output from this shift:    Labs:  Recent Labs  07/27/12 1800 07/27/12 2122 07/29/12 0644  WBC 14.6* 13.6* 10.4  HGB 11.2* 11.1* 11.8*  PLT 740* 716* 735*  CREATININE 1.16 1.16 1.38*   Estimated Creatinine Clearance: 38.2 ml/min (by C-G formula based on Cr of 1.38). No results found for this basename: VANCOTROUGH, VANCOPEAK, VANCORANDOM, GENTTROUGH, GENTPEAK, GENTRANDOM, TOBRATROUGH, TOBRAPEAK, TOBRARND, AMIKACINPEAK, AMIKACINTROU, AMIKACIN,  in the last 72 hours   Microbiology: Recent Results (from the past 720 hour(s))  CULTURE, BLOOD (ROUTINE X 2)     Status: None   Collection Time    07/16/12  7:40 AM      Result Value Range Status   Specimen Description BLOOD LEFT ARM   Final   Special Requests BOTTLES DRAWN AEROBIC AND ANAEROBIC 5CC   Final   Culture  Setup Time 07/16/2012 14:49   Final   Culture NO GROWTH 5 DAYS   Final   Report Status 07/22/2012 FINAL   Final  CULTURE, BLOOD (ROUTINE X 2)     Status: None   Collection Time    07/16/12  7:45 AM      Result Value Range Status   Specimen Description BLOOD RIGHT ARM   Final   Special Requests BOTTLES DRAWN AEROBIC AND ANAEROBIC 5CC   Final   Culture  Setup Time 07/16/2012 14:49   Final   Culture NO GROWTH 5 DAYS   Final   Report Status 07/22/2012 FINAL   Final  CULTURE, BLOOD (ROUTINE X 2)     Status: None   Collection Time    07/27/12   5:50 PM      Result Value Range Status   Specimen Description BLOOD ARM RIGHT   Final   Special Requests BOTTLES DRAWN AEROBIC AND ANAEROBIC 10CC   Final   Culture  Setup Time 07/28/2012 02:51   Final   Culture     Final   Value:        BLOOD CULTURE RECEIVED NO GROWTH TO DATE CULTURE WILL BE HELD FOR 5 DAYS BEFORE ISSUING A FINAL NEGATIVE REPORT   Report Status PENDING   Incomplete  CULTURE, BLOOD (ROUTINE X 2)     Status: None   Collection Time    07/27/12  6:00 PM      Result Value Range Status   Specimen Description BLOOD HAND RIGHT   Final   Special Requests BOTTLES DRAWN AEROBIC AND ANAEROBIC 10CC   Final   Culture  Setup Time 07/28/2012 02:51   Final   Culture     Final   Value:        BLOOD CULTURE RECEIVED NO GROWTH TO DATE CULTURE WILL BE HELD FOR 5 DAYS BEFORE ISSUING A FINAL NEGATIVE REPORT   Report Status PENDING   Incomplete  URINE CULTURE     Status: None   Collection Time    07/27/12  8:52 PM  Result Value Range Status   Specimen Description URINE, RANDOM   Final   Special Requests NONE   Final   Culture  Setup Time 07/27/2012 21:54   Final   Colony Count NO GROWTH   Final   Culture NO GROWTH   Final   Report Status 07/28/2012 FINAL   Final  MRSA PCR SCREENING     Status: None   Collection Time    07/27/12  9:46 PM      Result Value Range Status   MRSA by PCR NEGATIVE  NEGATIVE Final   Comment:            The GeneXpert MRSA Assay (FDA     approved for NASAL specimens     only), is one component of a     comprehensive MRSA colonization     surveillance program. It is not     intended to diagnose MRSA     infection nor to guide or     monitor treatment for     MRSA infections.    Medical History: Past Medical History  Diagnosis Date  . Coronary artery disease   . Hypertension   . Renal disorder   . High cholesterol   . Narcolepsy   . Myocardial infarction     1994  . Barrett's esophagus   . CHF (congestive heart failure)   . CAP (community  acquired pneumonia)     Medications:  Scheduled:  . amphetamine-dextroamphetamine  20 mg Oral BID  . aspirin  81 mg Oral Daily  . bisoprolol  10 mg Oral Daily  . buPROPion  150 mg Oral Daily  . calcium carbonate  1 tablet Oral Q breakfast  . docusate sodium  100 mg Oral BID  . donepezil  5 mg Oral QHS  . feeding supplement  1 Container Oral Q24H  . fluconazole  100 mg Oral Daily  . furosemide  20 mg Oral Daily  . heparin  5,000 Units Subcutaneous Q8H  . magic mouthwash w/lidocaine  15 mL Oral QID  . modafinil  200 mg Oral Daily  . pantoprazole  40 mg Oral Q breakfast  . piperacillin-tazobactam (ZOSYN)  IV  3.375 g Intravenous Q8H  . sertraline  50 mg Oral Daily  . simvastatin  20 mg Oral QPM  . sodium chloride  3 mL Intravenous Q12H  . sodium chloride  3 mL Intravenous Q12H  . Sodium Oxybate  3,000 mg Oral Custom  . sucralfate  1 g Oral Q6H  . tamsulosin  0.4 mg Oral QPC supper  . [DISCONTINUED] Armodafinil  250 mg Oral Daily  . [DISCONTINUED] furosemide  40 mg Intravenous Daily  . [DISCONTINUED] pantoprazole  40 mg Oral BID AC  . [DISCONTINUED] piperacillin-tazobactam  3.375 g Intravenous Q8H  . [DISCONTINUED] Sodium Oxybate  3,000 mg Oral BID  . [DISCONTINUED] vancomycin  1,000 mg Intravenous Q24H   Assessment: 79yo male with HCAP, to change from Vancomycin and Zosyn to Unasyn.  Blood cultures are negative to date.    Goal of Therapy:  resolution of infection  Plan:  1. D/C Zosyn 2. Unasyn 3mg  IV q6 3.  F/U cx  Marisue Humble, PharmD Clinical Pharmacist [facility]

## 2012-07-29 NOTE — Procedures (Signed)
Objective Swallowing Evaluation: Modified Barium Swallowing Study  Patient Details  Name: Kirk Townsend MRN: [id number] Date of Birth: June 08, 1928  Today's Date: 07/29/2012 Time: 0951-1010 SLP Time Calculation (min): 19 min  Past Medical History:  Past Medical History  Diagnosis Date  . Coronary artery disease   . Hypertension   . Renal disorder   . High cholesterol   . Narcolepsy   . Myocardial infarction     1994  . Barrett's esophagus   . CHF (congestive heart failure)   . CAP (community acquired pneumonia)    Past Surgical History:  Past Surgical History  Procedure Laterality Date  . Hernia repair    . Coronary angioplasty      1994. No stent.  . Esophagogastroduodenoscopy N/A 07/22/2012    Procedure: ESOPHAGOGASTRODUODENOSCOPY (EGD);  Surgeon: Theda Belfast, MD;  Location: [facility];  Service: Endoscopy;  Laterality: N/A;   HPI:  Kirk Townsend is an 79 y.o. male with hx of narcolepsy on Nuvigil and Adderal, Dementia on aricept, CAD, CHF, Barrett's esosphagus, recently admitted for candida esosphagitis on Diflucan, discharged 07/25/12 to the NH, brought back into the ER because staff noticed he has increased work of breathing and pulox showed 86%.  Pt apparently developed respiratory distress after meal and CXR concerning for asrpiation pna. Pt just d/c'd from hospital on 3/31. Seen by SLP on 3/20, demonstrated adequate swallow function though he reported shortness of breath occasionally with PO intake. Repeat BSE did not evidence aspriation, MBS to evaluate for silent aspiration.     Assessment / Plan / Recommendation Clinical Impression  Dysphagia Diagnosis: Mild oral phase dysphagia;Mild pharyngeal phase dysphagia Clinical impression: Pt presents with a mild oral dysphagia due to missing upper dentures. Will recommend mechanical soft diet for now, but pt may upgrade as soon as dentures are available. There is a mild pharyngeal dysphagia with two instances of trace, sensed  penetration due to mild discoordiation/premature spillage with cup sips of thin. With straw sips, there is no penetration and no aspiration was seen at all during the study. Esophageal sweep did not reveal any significant finding. Given result, unlikely that pt chronically aspirating though this is only a moment in time and aspiration is possible. Pts function indicates he would likely sense aspiration with significant cough. Recommend thin liquids, no SLP f/u needed.     Treatment Recommendation  No treatment recommended at this time    Diet Recommendation Dysphagia 3 (Mechanical Soft);Thin liquid   Liquid Administration via: Cup;Straw Medication Administration: Whole meds with puree Supervision: Patient able to self feed;Full supervision/cueing for compensatory strategies Compensations: Slow rate;Small sips/bites Postural Changes and/or Swallow Maneuvers: Seated upright 90 degrees;Upright 30-60 min after meal    Other  Recommendations Oral Care Recommendations: Oral care BID   Follow Up Recommendations  None    Frequency and Duration        Pertinent Vitals/Pain NA    SLP Swallow Goals     General HPI: Kirk Townsend is an 79 y.o. male with hx of narcolepsy on Nuvigil and Adderal, Dementia on aricept, CAD, CHF, Barrett's esosphagus, recently admitted for candida esosphagitis on Diflucan, discharged 07/25/12 to the NH, brought back into the ER because staff noticed he has increased work of breathing and pulox showed 86%.  Pt apparently developed respiratory distress after meal and CXR concerning for asrpiation pna. Pt just d/c'd from hospital on 3/31. Seen by SLP on 3/20, demonstrated adequate swallow function though he reported shortness of breath occasionally  with PO intake. Repeat BSE did not evidence aspriation, MBS to evaluate for silent aspiration. Type of Study: Modified Barium Swallowing Study Reason for Referral: Objectively evaluate swallowing function Previous Swallow  Assessment: BSE 3/20, 4/3 Diet Prior to this Study: NPO Temperature Spikes Noted: No Respiratory Status: Room air History of Recent Intubation: No Behavior/Cognition: Alert;Cooperative;Pleasant mood;Confused Oral Cavity - Dentition: Dentures, not available Oral Motor / Sensory Function: Within functional limits Self-Feeding Abilities: Able to feed self Patient Positioning: Upright in chair Baseline Vocal Quality: Clear Volitional Cough: Strong Volitional Swallow: Able to elicit Anatomy: Other (Comment) (appearance of Bony protrusion at C 4/5, 5/6) Pharyngeal Secretions: Not observed secondary MBS    Reason for Referral Objectively evaluate swallowing function   Oral Phase Oral Preparation/Oral Phase Oral Phase: Impaired Oral - Thin Oral - Thin Cup: Within functional limits Oral - Thin Straw: Within functional limits Oral - Solids Oral - Regular: Impaired mastication (missing dentures)   Pharyngeal Phase Pharyngeal Phase Pharyngeal Phase: Impaired Pharyngeal - Thin Pharyngeal - Thin Cup: Premature spillage to pyriform sinuses;Penetration/Aspiration before swallow Penetration/Aspiration details (thin cup): Material enters airway, remains ABOVE vocal cords then ejected out;Material enters airway, CONTACTS cords then ejected out;Material does not enter airway Pharyngeal - Thin Straw: Within functional limits Pharyngeal - Solids Pharyngeal - Puree: Within functional limits Pharyngeal - Regular: Within functional limits Pharyngeal - Pill: Within functional limits  Cervical Esophageal Phase    GO  Harlon Ditty, MA CCC-SLP [phone]             Claudine Mouton 07/29/2012, 10:19 AM

## 2012-07-29 NOTE — Progress Notes (Signed)
Clinical Child psychotherapist (CSW) observed that pt was transferred to unit 5500. CSW has informed covering CSW Genelle Bal LCSW who will begin following and assist with dc plans. This CSW is signing off.  Theresia Bough, MSW, Theresia Majors [phone]

## 2012-07-29 NOTE — Clinical Social Work Note (Signed)
CSW talked with Admissions staff at [facility] regarding patient discharging over the weekend. CSW advised by Jill Side that patient can d/c back on Saturday, however the MD's d/c summary must be submitted to facility by 10:30 am and the patient must arrive at the facility by 12 noon. MD made aware.  Genelle Bal, MSW, LCSW [phone]

## 2012-07-29 NOTE — Progress Notes (Signed)
[facility] Progress Note [place] TEAM 1 - Stepdown/ICU TEAM   Kirk Townsend HQI:[id number] DOB: February 25, 1929 DOA: 07/27/2012 PCP: Londell Moh, MD  Brief narrative: Kirk Townsend is an 79 y.o. male with hx of narcolepsy on Nuvigil and Adderal, Dementia on aricept, CAD, CHF, Barrett's esosphagus, recently admitted for candida esosphagitis on Diflucan, discharged 07/25/12 to the NH, brought back into the ER because staff noticed he has increased work of breathing and pulox showed 86%. En route, he was placed on Bipap, and had hypertensive response. He was given IV Lasix and improved. ABG: 7.45/35/paO2=65 RA. CXR showed increased bilateral lobe infiltrates. His Cr is normal. He has a leukocytosis with WBC of 14.6K. No EKG was seen. His troponin was negative. Hospitalist was asked to admit him for HCAP and CHF.   Assessment/Plan: Principal Problem:   Acute respiratory failure with hypoxia (A) HCAP (healthcare-associated pneumonia) vs Aspiration Pneumonia - b/l upper lobe - cont current antibiotics - MBS in AM for silent aspiration - Received IV Vanc and Zosyn for two days, blood cultures show NGTD, Narrowed to Zosyn this morning (4/4).   (B) Acute on chronic systolic CHF (congestive heart failure) - has diuresed 4.9 L - will transition back to home dose of lasix- 20 mg daily - add on probnp to labs from this morning.  probnp on 4/2 was 13244  Active Problems:   HTN (hypertension) - cont home meds    Acute esophagitis - EGD during recent admission for "hiccoughs"  - decrease protonix to daily (patient has candida esophagitis).  Is on Diflucan until 4/7. - on sucralfate.  Ulcers on roof of mouth - per wife dentures were left in during EGD resulting in these ulcers - cont  mouthwash and Diflucan  Dementia with acute delirium - D/C sitter at 8:30 am on 4/4.  No signs of delirium  Elevated Creatinine - Likely due to diuresis and vanc. - Lasix scaled back and vanc d/c'd  on 4/4  Code Status: full code  Family Communication: spoke with wife Disposition Plan: return to [facility] Sat???  Procedures: MBS pending  Antibiotics: Vanc 4/2 -- 4/4 Zosyn 4/2 --> Diflucan- 10 day course to end 4/7  DVT prophylaxis: Heparin  HPI/Subjective: No complaints.  Pleasantly confused, tells me he lives at home with his wife.   Objective: Blood pressure 102/84, pulse 77, temperature 98.7 F (37.1 C), temperature source Oral, resp. rate 24, height 5\' 7"  (1.702 m), weight 65.4 kg (144 lb 2.9 oz), SpO2 96.00%.  Intake/Output Summary (Last 24 hours) at 07/29/12 0835 Last data filed at 07/29/12 0102  Gross per 24 hour  Intake    253 ml  Output   2525 ml  Net  -2272 ml     Exam: General: Elderly thin male, appears weak, pleasant, calm, slightly confused, lying comfortably in bed. Lungs: Clear to auscultation (after cough) bilaterally without wheezes or crackles- off O2 wit pulse ox 94% on room air Cardiovascular: Regular rate and rhythm without murmur gallop or rub normal S1 and S2 Abdomen: Nontender, nondistended, soft, bowel sounds positive, no rebound, no ascites, no appreciable mass Extremities: No significant cyanosis, clubbing, or edema bilateral lower extremities  Data Reviewed: Basic Metabolic Panel:  Recent Labs Lab 07/23/12 0517 07/25/12 0421 07/27/12 1800 07/27/12 2122 07/29/12 0644  NA 131* 132* 131*  --  135  K 4.4 4.1 4.0  --  3.4*  CL 95* 97 97  --  96  CO2 23 25 24   --  27  GLUCOSE 107* 107* 122*  --  100*  BUN 22 20 22   --  22  CREATININE 1.20 1.17 1.16 1.16 1.38*  CALCIUM 8.9 8.5 9.0  --  9.2   Liver Function Tests:  Recent Labs Lab 07/27/12 1800  AST 50*  ALT 50  ALKPHOS 119*  BILITOT 0.3  PROT 6.2  ALBUMIN 2.3*   CBC:  Recent Labs Lab 07/27/12 1800 07/27/12 2122 07/29/12 0644  WBC 14.6* 13.6* 10.4  NEUTROABS 12.2*  --   --   HGB 11.2* 11.1* 11.8*  HCT 32.0* 32.1* 33.4*  MCV 93.3 93.9 91.3  PLT 740* 716* 735*    Cardiac Enzymes:  Recent Labs Lab 07/27/12 1800 07/27/12 2122 07/28/12 0326 07/28/12 1021  TROPONINI <0.30 <0.30 <0.30 <0.30   BNP (last 3 results)  Recent Labs  09/01/11 1500 07/27/12 1800  PROBNP 2960.0* 12393.0*    Recent Results (from the past 240 hour(s))  CULTURE, BLOOD (ROUTINE X 2)     Status: None   Collection Time    07/27/12  5:50 PM      Result Value Range Status   Specimen Description BLOOD ARM RIGHT   Final   Special Requests BOTTLES DRAWN AEROBIC AND ANAEROBIC 10CC   Final   Culture  Setup Time 07/28/2012 02:51   Final   Culture     Final   Value:        BLOOD CULTURE RECEIVED NO GROWTH TO DATE CULTURE WILL BE HELD FOR 5 DAYS BEFORE ISSUING A FINAL NEGATIVE REPORT   Report Status PENDING   Incomplete  CULTURE, BLOOD (ROUTINE X 2)     Status: None   Collection Time    07/27/12  6:00 PM      Result Value Range Status   Specimen Description BLOOD HAND RIGHT   Final   Special Requests BOTTLES DRAWN AEROBIC AND ANAEROBIC 10CC   Final   Culture  Setup Time 07/28/2012 02:51   Final   Culture     Final   Value:        BLOOD CULTURE RECEIVED NO GROWTH TO DATE CULTURE WILL BE HELD FOR 5 DAYS BEFORE ISSUING A FINAL NEGATIVE REPORT   Report Status PENDING   Incomplete  URINE CULTURE     Status: None   Collection Time    07/27/12  8:52 PM      Result Value Range Status   Specimen Description URINE, RANDOM   Final   Special Requests NONE   Final   Culture  Setup Time 07/27/2012 21:54   Final   Colony Count NO GROWTH   Final   Culture NO GROWTH   Final   Report Status 07/28/2012 FINAL   Final  MRSA PCR SCREENING     Status: None   Collection Time    07/27/12  9:46 PM      Result Value Range Status   MRSA by PCR NEGATIVE  NEGATIVE Final   Comment:            The GeneXpert MRSA Assay (FDA     approved for NASAL specimens     only), is one component of a     comprehensive MRSA colonization     surveillance program. It is not     intended to diagnose MRSA      infection nor to guide or     monitor treatment for     MRSA infections.     Studies:  Recent x-ray studies have  been reviewed in detail by the Attending Physician  Scheduled Meds:  Scheduled Meds: . amphetamine-dextroamphetamine  20 mg Oral BID  . aspirin  81 mg Oral Daily  . bisoprolol  10 mg Oral Daily  . buPROPion  150 mg Oral Daily  . calcium carbonate  1 tablet Oral Q breakfast  . docusate sodium  100 mg Oral BID  . donepezil  5 mg Oral QHS  . feeding supplement  1 Container Oral Q24H  . fluconazole  100 mg Oral Daily  . furosemide  20 mg Oral Daily  . heparin  5,000 Units Subcutaneous Q8H  . magic mouthwash w/lidocaine  15 mL Oral QID  . modafinil  200 mg Oral Daily  . [START ON 07/30/2012] pantoprazole  40 mg Oral Daily  . piperacillin-tazobactam (ZOSYN)  IV  3.375 g Intravenous Q8H  . sertraline  50 mg Oral Daily  . simvastatin  20 mg Oral QPM  . sodium chloride  3 mL Intravenous Q12H  . sodium chloride  3 mL Intravenous Q12H  . Sodium Oxybate  3,000 mg Oral Custom  . sucralfate  1 g Oral Q6H  . tamsulosin  0.4 mg Oral QPC supper   Continuous Infusions:    Romualdo Bolk [facility] Pager: [phone] 07/29/12  8:36 AM

## 2012-07-29 NOTE — Evaluation (Signed)
Physical Therapy Evaluation Patient Details Name: Kirk Townsend MRN: [id number] DOB: 1929/02/17 Today's Date: 07/29/2012 Time: 4098-1191 PT Time Calculation (min): 25 min  PT Assessment / Plan / Recommendation Clinical Impression  Pt adm with PNA and resp failure.  Needs skilled PT to maximize I and safety to decr burden of care.  Recommend return to SNF for continued rehab.    PT Assessment  Patient needs continued PT services    Follow Up Recommendations  SNF    Does the patient have the potential to tolerate intense rehabilitation      Barriers to Discharge        Equipment Recommendations  None recommended by PT    Recommendations for Other Services     Frequency Min 3X/week    Precautions / Restrictions Precautions Precautions: Fall   Pertinent Vitals/Pain SaO2 92% with amb on RA      Mobility  Bed Mobility Bed Mobility: Rolling Right Rolling Right: 4: Min assist;With rail Supine to Sit: 4: Min assist;HOB elevated Details for Bed Mobility Assistance: Verbal cues for technique and assist to raise trunk. Transfers Sit to Stand: 3: Mod assist;With upper extremity assist;From bed Stand to Sit: 4: Min assist;With upper extremity assist;With armrests;To chair/3-in-1 Details for Transfer Assistance: Assist to raise hips and for balance.  Pt with posterior lean Ambulation/Gait Ambulation/Gait Assistance: 3: Mod assist Ambulation Distance (Feet): 25 Feet Assistive device: Rolling walker Ambulation/Gait Assistance Details: Pt with posterior and left lean. Multiple verbal cues to stay closer to walker. Gait Pattern: Step-to pattern;Decreased step length - right;Decreased step length - left;Shuffle;Narrow base of support Gait velocity: decr    Exercises     PT Diagnosis: Difficulty walking;Generalized weakness  PT Problem List: Decreased strength;Decreased activity tolerance;Decreased balance;Decreased mobility;Decreased knowledge of precautions;Decreased knowledge of  use of DME PT Treatment Interventions: DME instruction;Patient/family education;Gait training;Functional mobility training;Therapeutic activities;Therapeutic exercise;Balance training   PT Goals Acute Rehab PT Goals PT Goal Formulation: With patient Time For Goal Achievement: 08/05/12 Potential to Achieve Goals: Good Pt will go Supine/Side to Sit: with supervision PT Goal: Supine/Side to Sit - Progress: Goal set today Pt will go Sit to Supine/Side: with supervision PT Goal: Sit to Supine/Side - Progress: Goal set today Pt will go Sit to Stand: with min assist PT Goal: Sit to Stand - Progress: Goal set today Pt will go Stand to Sit: with min assist PT Goal: Stand to Sit - Progress: Goal set today Pt will Ambulate: 51 - 150 feet;with min assist;with least restrictive assistive device PT Goal: Ambulate - Progress: Goal set today  Visit Information  Last PT Received On: 07/29/12 Assistance Needed: +1    Subjective Data  Subjective: Pt states he feels okay. Patient Stated Goal: Not stated.  Agreeable to work on incr mobility.   Prior Functioning  Home Living Lives With: Spouse Available Help at Discharge: Skilled Nursing Facility Type of Home: Skilled Nursing Facility Home Adaptive Equipment: Straight cane;Walker - rolling Additional Comments: Has been at [facility] since dc  Prior Function Level of Independence: Needs assistance Needs Assistance: Gait;Transfers Gait Assistance: min A 200' with RW during last adm Transfer Assistance: min A during last adm    Cognition  Cognition Overall Cognitive Status: Impaired Area of Impairment: Memory;Awareness of errors Arousal/Alertness: Awake/alert Behavior During Session: [facility] for tasks performed Awareness of Errors: Assistance required to correct errors made;Assistance required to identify errors made    Extremity/Trunk Assessment Right Lower Extremity Assessment RLE ROM/Strength/Tone: Deficits RLE ROM/Strength/Tone Deficits:  grossly 3+/5  Left Lower Extremity Assessment LLE ROM/Strength/Tone: Deficits LLE ROM/Strength/Tone Deficits: grossly 3+/5   Balance Balance Balance Assessed: Yes Dynamic Sitting Balance Dynamic Sitting - Balance Support: Bilateral upper extremity supported Dynamic Sitting - Level of Assistance: 3: Mod assist (posterior lean)  End of Session PT - End of Session Equipment Utilized During Treatment: Gait belt Activity Tolerance: Patient limited by fatigue Patient left: in chair;with call bell/phone within reach;with family/visitor present Nurse Communication: Mobility status  GP     Johanny Segers 07/29/2012, 4:03 PM  [employer] PT [phone]

## 2012-07-30 DIAGNOSIS — I10 Essential (primary) hypertension: Secondary | ICD-10-CM | POA: Diagnosis not present

## 2012-07-30 DIAGNOSIS — R0902 Hypoxemia: Secondary | ICD-10-CM | POA: Diagnosis not present

## 2012-07-30 DIAGNOSIS — R112 Nausea with vomiting, unspecified: Secondary | ICD-10-CM | POA: Diagnosis not present

## 2012-07-30 DIAGNOSIS — R066 Hiccough: Secondary | ICD-10-CM | POA: Diagnosis not present

## 2012-07-30 DIAGNOSIS — R110 Nausea: Secondary | ICD-10-CM | POA: Diagnosis not present

## 2012-07-30 DIAGNOSIS — K56609 Unspecified intestinal obstruction, unspecified as to partial versus complete obstruction: Secondary | ICD-10-CM | POA: Diagnosis not present

## 2012-07-30 DIAGNOSIS — D649 Anemia, unspecified: Secondary | ICD-10-CM | POA: Diagnosis not present

## 2012-07-30 DIAGNOSIS — J9600 Acute respiratory failure, unspecified whether with hypoxia or hypercapnia: Secondary | ICD-10-CM | POA: Diagnosis not present

## 2012-07-30 DIAGNOSIS — B3781 Candidal esophagitis: Secondary | ICD-10-CM | POA: Diagnosis not present

## 2012-07-30 DIAGNOSIS — J189 Pneumonia, unspecified organism: Secondary | ICD-10-CM | POA: Diagnosis not present

## 2012-07-30 DIAGNOSIS — I5023 Acute on chronic systolic (congestive) heart failure: Secondary | ICD-10-CM | POA: Diagnosis not present

## 2012-07-30 DIAGNOSIS — I2510 Atherosclerotic heart disease of native coronary artery without angina pectoris: Secondary | ICD-10-CM | POA: Diagnosis not present

## 2012-07-30 DIAGNOSIS — I509 Heart failure, unspecified: Secondary | ICD-10-CM | POA: Diagnosis not present

## 2012-07-30 DIAGNOSIS — Z5189 Encounter for other specified aftercare: Secondary | ICD-10-CM | POA: Diagnosis not present

## 2012-07-30 DIAGNOSIS — K209 Esophagitis, unspecified without bleeding: Secondary | ICD-10-CM | POA: Diagnosis not present

## 2012-07-30 DIAGNOSIS — N189 Chronic kidney disease, unspecified: Secondary | ICD-10-CM | POA: Diagnosis not present

## 2012-07-30 MED ORDER — AMOXICILLIN-POT CLAVULANATE 875-125 MG PO TABS: 1.0000 | ORAL_TABLET | Freq: Two times a day (BID) | ORAL | Status: AC

## 2012-07-30 MED ORDER — AMOXICILLIN-POT CLAVULANATE 875-125 MG PO TABS
1.0000 | ORAL_TABLET | Freq: Two times a day (BID) | ORAL | Status: DC
  Administered 2012-07-30: 1 via ORAL
  Filled 2012-07-30 (×2): qty 1

## 2012-07-30 NOTE — Progress Notes (Signed)
07/30/12 patient discharged to [facility] . IV site removed and med packed to go with patient.

## 2012-07-30 NOTE — Discharge Summary (Signed)
Physician Discharge Summary  MADDON HORTON ZOX:[id number] DOB: 07/17/1928 DOA: 07/27/2012  PCP: Londell Moh, MD  Admit date: 07/27/2012 Discharge date: 07/30/2012  Time spent: 30 minutes  Recommendations for Outpatient Follow-up:  1. Follow up with PCP check a b-met 2. palliative as an outpatient.  Discharge Diagnoses:  Principal Problem:   Acute respiratory failure with hypoxia Active Problems:   HTN (hypertension)   Acute esophagitis   HCAP (healthcare-associated pneumonia)   Acute on chronic systolic CHF (congestive heart failure)   Discharge Condition: guarded  Diet recommendation: carb modified dysphagia 1 diet  Filed Weights   07/27/12 2115  Weight: 65.4 kg (144 lb 2.9 oz)    History of present illness:  79 y.o. male with hx of narcolepsy on Nuvigil and Adderal, Dementia on aricept, CAD, CHF, Barrett's esosphagus, recently admitted for candida esosphagitis on Diflucan, discharged 07/25/12 to the NH, brought back into the ER because staff noticed he has increased work of breathing and pulox showed 86%. En route, he was placed on Bipap, and had hypertensive response. He was given IV Lasix and improved. ABG: 7.45/35/paO2=65 RA. CXR showed increased bilateral lobe infiltrates. His Cr is normal. He has a leukocytosis with WBC of 14.6K. No EKG was seen. His troponin was negative. Hospitalist was asked to admit him for HCAP and CHF.   Hospital Course:  Acute respiratory failure with hypoxia  Aspiration Pneumonia  - b/l upper lobe  - started on VAn zosyn empirically change to Unasyn once MBBS conform aspiration. - MBBS in AM for silent aspiration  - Received IV Vanc and Zosyn for two days, blood cultures show NGTD,    Acute on chronic systolic CHF (congestive heart failure)  - has diuresed 4.9 L, with IV lasix on admission as he was in mild CHF. - Transition back to home dose of lasix- 20 mg daily. - able to sleep flat.  HTN (hypertension)  - cont home meds.  Acute  esophagitis  - EGD during recent admission for "hiccoughs"  - decrease protonix to daily (patient has candida esophagitis). Is on Diflucan until 4/7.  - on sucralfate.   Ulcers on roof of mouth  - per wife dentures were left in during EGD resulting in these ulcers  - cont mouthwash and Diflucan.  Dementia with acute delirium  - D/C sitter at 8:30 am on 4/4. No signs of delirium   Elevated Creatinine  - Likely due to diuresis and vanc.  - Lasix scaled back and vanc d/c'd on 4/4   Procedures:  MBBS silent aspiration  Consultations:  none  Discharge Exam: Filed Vitals:   07/29/12 1448 07/29/12 1544 07/29/12 2051 07/30/12 0528  BP: 139/62  149/68 176/78  Pulse: 71  77 80  Temp: 97.4 F (36.3 C)  98.6 F (37 C) 97.6 F (36.4 C)  TempSrc: Oral  Oral Oral  Resp: 23  22 22   Height:      Weight:      SpO2: 96% 92% 94% 92%    General: A&O x3 Cardiovascular: RRR Respiratory: Good air movement CTA B/L  Discharge Instructions  Discharge Orders   Future Orders Complete By Expires     Diet - low sodium heart healthy  As directed     Increase activity slowly  As directed         Medication List    TAKE these medications       amoxicillin-clavulanate 875-125 MG per tablet  Commonly known as:  AUGMENTIN  Take 1 tablet  by mouth every 12 (twelve) hours.     amphetamine-dextroamphetamine 10 MG tablet  Commonly known as:  ADDERALL  Take 20 mg by mouth 2 (two) times daily.     aspirin 81 MG chewable tablet  Chew 81 mg by mouth daily.     bisoprolol 10 MG tablet  Commonly known as:  ZEBETA  Take 1 tablet (10 mg total) by mouth daily.     buPROPion 150 MG 24 hr tablet  Commonly known as:  WELLBUTRIN XL  Take 150 mg by mouth daily.     calcium carbonate 1250 MG tablet  Commonly known as:  OS-CAL - dosed in mg of elemental calcium  Take 1 tablet by mouth daily.     chlorproMAZINE 50 MG tablet  Commonly known as:  THORAZINE  Take 1 tablet (50 mg total) by mouth  4 (four) times daily as needed. For hiccups     donepezil 5 MG tablet  Commonly known as:  ARICEPT  Take 5 mg by mouth at bedtime.     feeding supplement Liqd  Take 1 Container by mouth daily.     lactose free nutrition Liqd  Take 237 mLs by mouth daily.     fluconazole 100 MG tablet  Commonly known as:  DIFLUCAN  Take 1 tablet (100 mg total) by mouth daily. Take for 7 days.     furosemide 20 MG tablet  Commonly known as:  LASIX  Take 1 tablet (20 mg total) by mouth daily.     magic mouthwash w/lidocaine Soln  Take 15 mLs by mouth 4 (four) times daily. tAKE FOR 10 DAYS THEN USE AS NEEDED     multivitamin with minerals Tabs  Take 1 tablet by mouth daily.     NUVIGIL 250 MG tablet  Generic drug:  Armodafinil  Take 250 mg by mouth daily.     pantoprazole 40 MG tablet  Commonly known as:  PROTONIX  Take 1 tablet (40 mg total) by mouth 2 (two) times daily before a meal.     promethazine 25 MG tablet  Commonly known as:  PHENERGAN  Take 1 tablet (25 mg total) by mouth every 6 (six) hours as needed for nausea. For hiccups     sertraline 50 MG tablet  Commonly known as:  ZOLOFT  Take 50 mg by mouth daily.     simvastatin 20 MG tablet  Commonly known as:  ZOCOR  Take 20 mg by mouth every evening.     sucralfate 1 GM/10ML suspension  Commonly known as:  CARAFATE  Take 10 mLs (1 g total) by mouth every 6 (six) hours. tAKE FOR 10 DAYS THEN USE AS NEEDED.     tamsulosin 0.4 MG Caps  Commonly known as:  FLOMAX  Take 1 capsule (0.4 mg total) by mouth daily after supper.     vitamin C 500 MG tablet  Commonly known as:  ASCORBIC ACID  Take 500 mg by mouth daily.     XYREM PO  Take 4 g by mouth 2 (two) times daily.           Follow-up Information   Follow up with Londell Moh, MD In 2 weeks. (follow up with PC in 2-4 weeks)    Contact information:   [address] [place] [address] [phone]        The results of significant  diagnostics from this hospitalization (including imaging, microbiology, ancillary and laboratory) are listed below for reference.  Significant Diagnostic Studies: Dg Chest 2 View  07/12/2012  *RADIOLOGY REPORT*  Clinical Data: Generalized weakness.  Hiccups.  Chest discomfort.  CHEST - 2 VIEW  Comparison: Two-view chest x-ray 11/23/2011, 09/01/2011 and 01/06/2007 [facility].  Findings: Cardiac silhouette mildly enlarged but stable.  Thoracic aorta atherosclerotic, unchanged.  Hilar and mediastinal contours otherwise unremarkable.  Hyperinflation, unchanged.  Lungs clear. Bronchovascular markings normal.  Pulmonary vascularity normal.  No pneumothorax.  No pleural effusions.  Old healed fracture involving the distal left clavicle and degenerative changes throughout the thoracic spine.  IMPRESSION: Stable COPD/emphysema and stable mild cardiomegaly.  No acute cardiopulmonary disease.   Original Report Authenticated By: Hulan Saas, M.D.    Mr Brain Wo Contrast  07/14/2012  *RADIOLOGY REPORT*  Clinical Data: 79 year old male with sudden onset slurred speech.  MRI HEAD WITHOUT CONTRAST  Technique:  Multiplanar, multiecho pulse sequences of the brain and surrounding structures were obtained according to standard protocol without intravenous contrast.  Comparison: None.  Findings: The examination had to be discontinued prior to completion due to patient refusal to continue.  Axial diffusion, T2 and FLAIR imaging plus sagittal T1-weighted imaging was acquired.  No restricted diffusion to suggest acute infarction.  Major intracranial vascular flow voids are preserved.  Cerebral volume loss appears fairly generalized with ex vacuo changes of the ventricles.   No midline shift or other intracranial mass effect. Sulcal variation or disproportionate volume loss at the right calcarine sulcus.  Cannot exclude a small left vertex arachnoid cyst mildly affecting the left superior frontal gyrus (series 3 image  14 and series 7 image 25). No definite cortical encephalomalacia.  Mild to moderate for age nonspecific periventricular white matter T2 and FLAIR hyperintensity.  Negative pituitary gland, cervicomedullary junction and visualized cervical spine.  Normal bone marrow signal.  Postoperative changes to the globes. Visualized paranasal sinuses and mastoids are clear.  IMPRESSION: 1.  No acute infarct. No acute intracranial abnormality identified. 2.  Cerebral volume loss and mild to moderate nonspecific cerebral white matter signal changes. 3. The examination had to be discontinued prior to completion.  .   Original Report Authenticated By: Erskine Speed, M.D.    US Abdomen Complete  07/13/2012  *RADIOLOGY REPORT*  Clinical Data:  Abdominal pain.  COMPLETE ABDOMINAL ULTRASOUND  Comparison:  CT scan dated 07/12/2012  Findings:  Gallbladder:  There is a solitary 14 mm gallstone which moves with change in patient position.  Gallbladder wall is not thickened. Negative sonographic Murphy's sign.  The  Common bile duct:  Normal.  5.4 mm diameter.  Liver:  Normal.  IVC:  Normal.  Pancreas:  The pancreatic parenchyma is normal.  Pancreatic duct is slightly prominent at 2.7 mm.  No visible mass in the pancreatic head.  Spleen:  Normal.  4.6 cm in length.  Right Kidney:  Normal.  9.6 cm in length.  Left Kidney:  Normal.  8.9 cm in length.  Abdominal aorta:  Normal.  2.7 cm maximal diameter.  There are bilateral pleural effusions, left greater than right.  IMPRESSION: Solitary gallstone.  No findings of cholecystitis.  Minimal prominence of the pancreatic duct, nonspecific.  Otherwise, normal appearing abdomen.   Original Report Authenticated By: Francene Boyers, M.D.    Ct Abdomen Pelvis W Contrast  07/12/2012  *RADIOLOGY REPORT*  Clinical Data: Vomiting since last night.  Generalized weakness and hiccups.  Shortness of breath.  CT ABDOMEN AND PELVIS WITH CONTRAST  Technique:  Multidetector CT imaging of the abdomen and pelvis  was performed following the standard protocol during bolus administration of intravenous contrast.  Contrast: 80mL OMNIPAQUE IOHEXOL 300 MG/ML  SOLN  Comparison: None.  Findings: Technically limited study due to motion artifact.  Dependent atelectasis and motion artifact in the lung bases. Pectus excavatum.  Cardiac enlargement.  Mild diffuse fatty infiltration of the liver.  No focal liver lesions.  Cholelithiasis with single gallstone in the dependent portion of the gallbladder.  No gallbladder wall thickening or edema.  The pancreas is atrophic.  Spleen size is normal.  No adrenal gland nodules.  No solid mass or hydronephrosis in either kidney.  The stomach is not abnormally distended.  Small bowel are not distended but there is suggestion of wall thickening of left upper quadrant jejunal loops.  This may represent gastroenteritis or inflammatory process.  Distal small bowel are unremarkable. Colon is fluid-filled consistent with liquid stool.  No colonic distension.  No free air or free fluid in the abdomen.  Pelvis:  Prostate gland does not appear enlarged.  Bladder wall is not thickened.  No free or loculated pelvic fluid collections.  No pelvic lymphadenopathy.  Appendix is not identified.  No diverticulitis despite multiple sigmoid diverticula.  Degenerative changes throughout the lumbar spine.  No destructive bone lesions.  IMPRESSION: Cholelithiasis.  Suggestion of small bowel wall thickening involving the jejunum and liquid stool in the colon.  Changes likely to represent gastroenteritis or other inflammatory process. Mild fatty infiltration of the liver.   Original Report Authenticated By: Burman Nieves, M.D.    Dg Chest Port 1 View  07/27/2012  *RADIOLOGY REPORT*  Clinical Data: Shortness of breath  PORTABLE CHEST - 1 VIEW  Comparison: 07/15/2012  Findings: The cardiac shadow is stable.  There is worsening bilateral upper lobe airspace disease when compared with prior exam.  Old rib fractures are  again noted on the left.  No sizable effusion is seen.  IMPRESSION: Increasing bilateral upper lobe infiltrates.   Original Report Authenticated By: Alcide Clever, M.D.    Dg Chest Port 1 View  07/15/2012  *RADIOLOGY REPORT*  Clinical Data: Shortness of breath, weakness and fatigue.  PORTABLE CHEST - 1 VIEW  Comparison: Chest x-ray 07/12/2012.  Findings: Compared to the prior study there is now diffuse interstitial prominence throughout all aspects of the lungs bilaterally.  Importantly, however, there is asymmetric airspace consolidation involving the right upper lobe.  No definite pleural effusions.  Pulmonary vasculature appears engorged.  Mild cardiomegaly is unchanged.  Atherosclerosis in the thoracic aorta.  IMPRESSION: 1.  Asymmetric airspace consolidation in the right upper lobe concerning for pneumonia. 2.  Cardiomegaly with cephalization of the pulmonary vasculature and diffuse interstitial prominence suggestive of background of mild interstitial pulmonary edema, possibly related to congestive heart failure. 3.  Atherosclerosis.   Original Report Authenticated By: Trudie Reed, M.D.    Dg Swallowing Func-speech Pathology  07/29/2012  Riley Nearing Deblois, CCC-SLP     07/29/2012 10:20 AM Objective Swallowing Evaluation: Modified Barium Swallowing Study   Patient Details  Name: RHYLAN KAGEL MRN: [id number] Date of Birth: 11/07/1928  Today's Date: 07/29/2012 Time: 0951-1010 SLP Time Calculation (min): 19 min  Past Medical History:  Past Medical History  Diagnosis Date  . Coronary artery disease   . Hypertension   . Renal disorder   . High cholesterol   . Narcolepsy   . Myocardial infarction     1994  . Barrett's esophagus   . CHF (congestive heart failure)   . CAP (community acquired pneumonia)  Past Surgical History:  Past Surgical History  Procedure Laterality Date  . Hernia repair    . Coronary angioplasty      1994. No stent.  . Esophagogastroduodenoscopy N/A 07/22/2012    Procedure:  ESOPHAGOGASTRODUODENOSCOPY (EGD);  Surgeon: Theda Belfast, MD;  Location: [facility];  Service: Endoscopy;   Laterality: N/A;   HPI:  OSHUA MCCONAHA is an 79 y.o. male with hx of narcolepsy on Nuvigil  and Adderal, Dementia on aricept, CAD, CHF, Barrett's esosphagus,  recently admitted for candida esosphagitis on Diflucan,  discharged 07/25/12 to the NH, brought back into the ER because  staff noticed he has increased work of breathing and pulox showed  86%.  Pt apparently developed respiratory distress after meal and  CXR concerning for asrpiation pna. Pt just d/c'd from hospital on  3/31. Seen by SLP on 3/20, demonstrated adequate swallow function  though he reported shortness of breath occasionally with PO  intake. Repeat BSE did not evidence aspriation, MBS to evaluate  for silent aspiration.     Assessment / Plan / Recommendation Clinical Impression  Dysphagia Diagnosis: Mild oral phase dysphagia;Mild pharyngeal  phase dysphagia Clinical impression: Pt presents with a mild oral dysphagia due  to missing upper dentures. Will recommend mechanical soft diet  for now, but pt may upgrade as soon as dentures are available.  There is a mild pharyngeal dysphagia with two instances of trace,  sensed penetration due to mild discoordiation/premature spillage  with cup sips of thin. With straw sips, there is no penetration  and no aspiration was seen at all during the study. Esophageal  sweep did not reveal any significant finding. Given result,  unlikely that pt chronically aspirating though this is only a  moment in time and aspiration is possible. Pts function indicates  he would likely sense aspiration with significant cough.  Recommend thin liquids, no SLP f/u needed.     Treatment Recommendation  No treatment recommended at this time    Diet Recommendation Dysphagia 3 (Mechanical Soft);Thin liquid   Liquid Administration via: Cup;Straw Medication Administration: Whole meds with puree Supervision: Patient able to  self feed;Full supervision/cueing  for compensatory strategies Compensations: Slow rate;Small sips/bites Postural Changes and/or Swallow Maneuvers: Seated upright 90  degrees;Upright 30-60 min after meal    Other  Recommendations Oral Care Recommendations: Oral care BID   Follow Up Recommendations  None    Frequency and Duration        Pertinent Vitals/Pain NA    SLP Swallow Goals     General HPI: TERIUS JACUINDE is an 79 y.o. male with hx of  narcolepsy on Nuvigil and Adderal, Dementia on aricept, CAD, CHF,  Barrett's esosphagus, recently admitted for candida esosphagitis  on Diflucan, discharged 07/25/12 to the NH, brought back into the  ER because staff noticed he has increased work of breathing and  pulox showed 86%.  Pt apparently developed respiratory distress  after meal and CXR concerning for asrpiation pna. Pt just d/c'd  from hospital on 3/31. Seen by SLP on 3/20, demonstrated adequate  swallow function though he reported shortness of breath  occasionally with PO intake. Repeat BSE did not evidence  aspriation, MBS to evaluate for silent aspiration. Type of Study: Modified Barium Swallowing Study Reason for Referral: Objectively evaluate swallowing function Previous Swallow Assessment: BSE 3/20, 4/3 Diet Prior to this Study: NPO Temperature Spikes Noted: No Respiratory Status: Room air History of Recent Intubation: No Behavior/Cognition: Alert;Cooperative;Pleasant mood;Confused Oral Cavity - Dentition:  Dentures, not available Oral Motor / Sensory Function: Within functional limits Self-Feeding Abilities: Able to feed self Patient Positioning: Upright in chair Baseline Vocal Quality: Clear Volitional Cough: Strong Volitional Swallow: Able to elicit Anatomy: Other (Comment) (appearance of Bony protrusion at C 4/5,  5/6) Pharyngeal Secretions: Not observed secondary MBS    Reason for Referral Objectively evaluate swallowing function   Oral Phase Oral Preparation/Oral Phase Oral Phase: Impaired Oral - Thin Oral -  Thin Cup: Within functional limits Oral - Thin Straw: Within functional limits Oral - Solids Oral - Regular: Impaired mastication (missing dentures)   Pharyngeal Phase Pharyngeal Phase Pharyngeal Phase: Impaired Pharyngeal - Thin Pharyngeal - Thin Cup: Premature spillage to pyriform  sinuses;Penetration/Aspiration before swallow Penetration/Aspiration details (thin cup): Material enters  airway, remains ABOVE vocal cords then ejected out;Material  enters airway, CONTACTS cords then ejected out;Material does not  enter airway Pharyngeal - Thin Straw: Within functional limits Pharyngeal - Solids Pharyngeal - Puree: Within functional limits Pharyngeal - Regular: Within functional limits Pharyngeal - Pill: Within functional limits  Cervical Esophageal Phase    GO  Harlon Ditty, MA CCC-SLP [phone]             Claudine Mouton 07/29/2012, 10:19 AM      Microbiology: Recent Results (from the past 240 hour(s))  CULTURE, BLOOD (ROUTINE X 2)     Status: None   Collection Time    07/27/12  5:50 PM      Result Value Range Status   Specimen Description BLOOD ARM RIGHT   Final   Special Requests BOTTLES DRAWN AEROBIC AND ANAEROBIC 10CC   Final   Culture  Setup Time 07/28/2012 02:51   Final   Culture     Final   Value:        BLOOD CULTURE RECEIVED NO GROWTH TO DATE CULTURE WILL BE HELD FOR 5 DAYS BEFORE ISSUING A FINAL NEGATIVE REPORT   Report Status PENDING   Incomplete  CULTURE, BLOOD (ROUTINE X 2)     Status: None   Collection Time    07/27/12  6:00 PM      Result Value Range Status   Specimen Description BLOOD HAND RIGHT   Final   Special Requests BOTTLES DRAWN AEROBIC AND ANAEROBIC 10CC   Final   Culture  Setup Time 07/28/2012 02:51   Final   Culture     Final   Value:        BLOOD CULTURE RECEIVED NO GROWTH TO DATE CULTURE WILL BE HELD FOR 5 DAYS BEFORE ISSUING A FINAL NEGATIVE REPORT   Report Status PENDING   Incomplete  URINE CULTURE     Status: None   Collection Time    07/27/12  8:52  PM      Result Value Range Status   Specimen Description URINE, RANDOM   Final   Special Requests NONE   Final   Culture  Setup Time 07/27/2012 21:54   Final   Colony Count NO GROWTH   Final   Culture NO GROWTH   Final   Report Status 07/28/2012 FINAL   Final  MRSA PCR SCREENING     Status: None   Collection Time    07/27/12  9:46 PM      Result Value Range Status   MRSA by PCR NEGATIVE  NEGATIVE Final   Comment:            The GeneXpert MRSA Assay (FDA     approved for NASAL specimens  only), is one component of a     comprehensive MRSA colonization     surveillance program. It is not     intended to diagnose MRSA     infection nor to guide or     monitor treatment for     MRSA infections.     Labs: Basic Metabolic Panel:  Recent Labs Lab 07/25/12 0421 07/27/12 1800 07/27/12 2122 07/29/12 0644  NA 132* 131*  --  135  K 4.1 4.0  --  3.4*  CL 97 97  --  96  CO2 25 24  --  27  GLUCOSE 107* 122*  --  100*  BUN 20 22  --  22  CREATININE 1.17 1.16 1.16 1.38*  CALCIUM 8.5 9.0  --  9.2   Liver Function Tests:  Recent Labs Lab 07/27/12 1800  AST 50*  ALT 50  ALKPHOS 119*  BILITOT 0.3  PROT 6.2  ALBUMIN 2.3*   No results found for this basename: LIPASE, AMYLASE,  in the last 168 hours No results found for this basename: AMMONIA,  in the last 168 hours CBC:  Recent Labs Lab 07/27/12 1800 07/27/12 2122 07/29/12 0644  WBC 14.6* 13.6* 10.4  NEUTROABS 12.2*  --   --   HGB 11.2* 11.1* 11.8*  HCT 32.0* 32.1* 33.4*  MCV 93.3 93.9 91.3  PLT 740* 716* 735*   Cardiac Enzymes:  Recent Labs Lab 07/27/12 1800 07/27/12 2122 07/28/12 0326 07/28/12 1021  TROPONINI <0.30 <0.30 <0.30 <0.30   BNP: BNP (last 3 results)  Recent Labs  09/01/11 1500 07/27/12 1800 07/29/12 0845  PROBNP 2960.0* 12393.0* 9314.0*   CBG: No results found for this basename: GLUCAP,  in the last 168 hours     Signed:  Marinda Elk  [facility] 07/30/2012,  9:19 AM

## 2012-07-30 NOTE — Clinical Social Work Note (Signed)
Patient to return to [facility] today via [facility]. Left message for patient's wife. Facility aware of transfer. Discharge packet complete. CSW signing off.  Ricke Hey, [place] [phone] (weekend)

## 2012-08-02 DIAGNOSIS — D649 Anemia, unspecified: Secondary | ICD-10-CM | POA: Diagnosis not present

## 2012-08-02 DIAGNOSIS — K56609 Unspecified intestinal obstruction, unspecified as to partial versus complete obstruction: Secondary | ICD-10-CM | POA: Diagnosis not present

## 2012-08-02 DIAGNOSIS — R110 Nausea: Secondary | ICD-10-CM | POA: Diagnosis not present

## 2012-08-03 LAB — CULTURE, BLOOD (ROUTINE X 2): Culture: NO GROWTH

## 2012-08-08 DIAGNOSIS — B3781 Candidal esophagitis: Secondary | ICD-10-CM | POA: Diagnosis not present

## 2012-08-08 DIAGNOSIS — R066 Hiccough: Secondary | ICD-10-CM | POA: Diagnosis not present

## 2012-08-09 DIAGNOSIS — R110 Nausea: Secondary | ICD-10-CM | POA: Diagnosis not present

## 2012-08-09 DIAGNOSIS — K56609 Unspecified intestinal obstruction, unspecified as to partial versus complete obstruction: Secondary | ICD-10-CM | POA: Diagnosis not present

## 2012-08-09 DIAGNOSIS — D649 Anemia, unspecified: Secondary | ICD-10-CM | POA: Diagnosis not present

## 2012-08-12 DIAGNOSIS — R110 Nausea: Secondary | ICD-10-CM | POA: Diagnosis not present

## 2012-08-12 DIAGNOSIS — D649 Anemia, unspecified: Secondary | ICD-10-CM | POA: Diagnosis not present

## 2012-08-12 DIAGNOSIS — K56609 Unspecified intestinal obstruction, unspecified as to partial versus complete obstruction: Secondary | ICD-10-CM | POA: Diagnosis not present

## 2012-08-16 DIAGNOSIS — R110 Nausea: Secondary | ICD-10-CM | POA: Diagnosis not present

## 2012-08-16 DIAGNOSIS — K56609 Unspecified intestinal obstruction, unspecified as to partial versus complete obstruction: Secondary | ICD-10-CM | POA: Diagnosis not present

## 2012-08-16 DIAGNOSIS — D649 Anemia, unspecified: Secondary | ICD-10-CM | POA: Diagnosis not present

## 2012-08-25 DIAGNOSIS — R066 Hiccough: Secondary | ICD-10-CM | POA: Diagnosis not present

## 2012-08-25 DIAGNOSIS — N189 Chronic kidney disease, unspecified: Secondary | ICD-10-CM | POA: Diagnosis not present

## 2012-08-25 DIAGNOSIS — Z5189 Encounter for other specified aftercare: Secondary | ICD-10-CM | POA: Diagnosis not present

## 2012-08-25 DIAGNOSIS — J189 Pneumonia, unspecified organism: Secondary | ICD-10-CM | POA: Diagnosis not present

## 2012-08-25 DIAGNOSIS — I509 Heart failure, unspecified: Secondary | ICD-10-CM | POA: Diagnosis not present

## 2012-08-25 DIAGNOSIS — J9600 Acute respiratory failure, unspecified whether with hypoxia or hypercapnia: Secondary | ICD-10-CM | POA: Diagnosis not present

## 2012-08-25 DIAGNOSIS — I10 Essential (primary) hypertension: Secondary | ICD-10-CM | POA: Diagnosis not present

## 2012-08-25 DIAGNOSIS — R112 Nausea with vomiting, unspecified: Secondary | ICD-10-CM | POA: Diagnosis not present

## 2012-08-25 DIAGNOSIS — I2510 Atherosclerotic heart disease of native coronary artery without angina pectoris: Secondary | ICD-10-CM | POA: Diagnosis not present

## 2012-08-25 DIAGNOSIS — R0902 Hypoxemia: Secondary | ICD-10-CM | POA: Diagnosis not present

## 2012-09-02 DIAGNOSIS — M6281 Muscle weakness (generalized): Secondary | ICD-10-CM | POA: Diagnosis not present

## 2012-09-02 DIAGNOSIS — F0280 Dementia in other diseases classified elsewhere without behavioral disturbance: Secondary | ICD-10-CM | POA: Diagnosis not present

## 2012-09-02 DIAGNOSIS — I5023 Acute on chronic systolic (congestive) heart failure: Secondary | ICD-10-CM | POA: Diagnosis not present

## 2012-09-02 DIAGNOSIS — J9600 Acute respiratory failure, unspecified whether with hypoxia or hypercapnia: Secondary | ICD-10-CM | POA: Diagnosis not present

## 2012-09-02 DIAGNOSIS — Z8701 Personal history of pneumonia (recurrent): Secondary | ICD-10-CM | POA: Diagnosis not present

## 2012-09-02 DIAGNOSIS — I129 Hypertensive chronic kidney disease with stage 1 through stage 4 chronic kidney disease, or unspecified chronic kidney disease: Secondary | ICD-10-CM | POA: Diagnosis not present

## 2012-09-02 DIAGNOSIS — Z8719 Personal history of other diseases of the digestive system: Secondary | ICD-10-CM | POA: Diagnosis not present

## 2012-09-02 DIAGNOSIS — I2510 Atherosclerotic heart disease of native coronary artery without angina pectoris: Secondary | ICD-10-CM | POA: Diagnosis not present

## 2012-09-02 DIAGNOSIS — G47419 Narcolepsy without cataplexy: Secondary | ICD-10-CM | POA: Diagnosis not present

## 2012-09-02 DIAGNOSIS — N189 Chronic kidney disease, unspecified: Secondary | ICD-10-CM | POA: Diagnosis not present

## 2012-09-05 DIAGNOSIS — I5023 Acute on chronic systolic (congestive) heart failure: Secondary | ICD-10-CM | POA: Diagnosis not present

## 2012-09-05 DIAGNOSIS — G309 Alzheimer's disease, unspecified: Secondary | ICD-10-CM | POA: Diagnosis not present

## 2012-09-05 DIAGNOSIS — M6281 Muscle weakness (generalized): Secondary | ICD-10-CM | POA: Diagnosis not present

## 2012-09-05 DIAGNOSIS — N189 Chronic kidney disease, unspecified: Secondary | ICD-10-CM | POA: Diagnosis not present

## 2012-09-05 DIAGNOSIS — J9600 Acute respiratory failure, unspecified whether with hypoxia or hypercapnia: Secondary | ICD-10-CM | POA: Diagnosis not present

## 2012-09-05 DIAGNOSIS — I129 Hypertensive chronic kidney disease with stage 1 through stage 4 chronic kidney disease, or unspecified chronic kidney disease: Secondary | ICD-10-CM | POA: Diagnosis not present

## 2012-09-07 DIAGNOSIS — G309 Alzheimer's disease, unspecified: Secondary | ICD-10-CM | POA: Diagnosis not present

## 2012-09-07 DIAGNOSIS — I129 Hypertensive chronic kidney disease with stage 1 through stage 4 chronic kidney disease, or unspecified chronic kidney disease: Secondary | ICD-10-CM | POA: Diagnosis not present

## 2012-09-07 DIAGNOSIS — J9600 Acute respiratory failure, unspecified whether with hypoxia or hypercapnia: Secondary | ICD-10-CM | POA: Diagnosis not present

## 2012-09-07 DIAGNOSIS — N189 Chronic kidney disease, unspecified: Secondary | ICD-10-CM | POA: Diagnosis not present

## 2012-09-07 DIAGNOSIS — I5023 Acute on chronic systolic (congestive) heart failure: Secondary | ICD-10-CM | POA: Diagnosis not present

## 2012-09-07 DIAGNOSIS — M6281 Muscle weakness (generalized): Secondary | ICD-10-CM | POA: Diagnosis not present

## 2012-09-09 DIAGNOSIS — N189 Chronic kidney disease, unspecified: Secondary | ICD-10-CM | POA: Diagnosis not present

## 2012-09-09 DIAGNOSIS — I129 Hypertensive chronic kidney disease with stage 1 through stage 4 chronic kidney disease, or unspecified chronic kidney disease: Secondary | ICD-10-CM | POA: Diagnosis not present

## 2012-09-09 DIAGNOSIS — G309 Alzheimer's disease, unspecified: Secondary | ICD-10-CM | POA: Diagnosis not present

## 2012-09-09 DIAGNOSIS — J9600 Acute respiratory failure, unspecified whether with hypoxia or hypercapnia: Secondary | ICD-10-CM | POA: Diagnosis not present

## 2012-09-09 DIAGNOSIS — I5023 Acute on chronic systolic (congestive) heart failure: Secondary | ICD-10-CM | POA: Diagnosis not present

## 2012-09-09 DIAGNOSIS — M6281 Muscle weakness (generalized): Secondary | ICD-10-CM | POA: Diagnosis not present

## 2012-09-13 DIAGNOSIS — J9600 Acute respiratory failure, unspecified whether with hypoxia or hypercapnia: Secondary | ICD-10-CM | POA: Diagnosis not present

## 2012-09-13 DIAGNOSIS — M6281 Muscle weakness (generalized): Secondary | ICD-10-CM | POA: Diagnosis not present

## 2012-09-13 DIAGNOSIS — I5023 Acute on chronic systolic (congestive) heart failure: Secondary | ICD-10-CM | POA: Diagnosis not present

## 2012-09-13 DIAGNOSIS — N189 Chronic kidney disease, unspecified: Secondary | ICD-10-CM | POA: Diagnosis not present

## 2012-09-13 DIAGNOSIS — I129 Hypertensive chronic kidney disease with stage 1 through stage 4 chronic kidney disease, or unspecified chronic kidney disease: Secondary | ICD-10-CM | POA: Diagnosis not present

## 2012-09-13 DIAGNOSIS — G309 Alzheimer's disease, unspecified: Secondary | ICD-10-CM | POA: Diagnosis not present

## 2012-09-14 DIAGNOSIS — I10 Essential (primary) hypertension: Secondary | ICD-10-CM | POA: Diagnosis not present

## 2012-09-14 DIAGNOSIS — E40 Kwashiorkor: Secondary | ICD-10-CM | POA: Diagnosis not present

## 2012-09-14 DIAGNOSIS — R413 Other amnesia: Secondary | ICD-10-CM | POA: Diagnosis not present

## 2012-09-14 DIAGNOSIS — K219 Gastro-esophageal reflux disease without esophagitis: Secondary | ICD-10-CM | POA: Diagnosis not present

## 2012-09-14 DIAGNOSIS — J189 Pneumonia, unspecified organism: Secondary | ICD-10-CM | POA: Diagnosis not present

## 2012-09-14 DIAGNOSIS — B3781 Candidal esophagitis: Secondary | ICD-10-CM | POA: Diagnosis not present

## 2012-09-17 DIAGNOSIS — N189 Chronic kidney disease, unspecified: Secondary | ICD-10-CM | POA: Diagnosis not present

## 2012-09-17 DIAGNOSIS — J9600 Acute respiratory failure, unspecified whether with hypoxia or hypercapnia: Secondary | ICD-10-CM | POA: Diagnosis not present

## 2012-09-17 DIAGNOSIS — M6281 Muscle weakness (generalized): Secondary | ICD-10-CM | POA: Diagnosis not present

## 2012-09-17 DIAGNOSIS — F0280 Dementia in other diseases classified elsewhere without behavioral disturbance: Secondary | ICD-10-CM | POA: Diagnosis not present

## 2012-09-17 DIAGNOSIS — I129 Hypertensive chronic kidney disease with stage 1 through stage 4 chronic kidney disease, or unspecified chronic kidney disease: Secondary | ICD-10-CM | POA: Diagnosis not present

## 2012-09-17 DIAGNOSIS — I5023 Acute on chronic systolic (congestive) heart failure: Secondary | ICD-10-CM | POA: Diagnosis not present

## 2012-10-03 DIAGNOSIS — I10 Essential (primary) hypertension: Secondary | ICD-10-CM | POA: Diagnosis not present

## 2012-10-03 DIAGNOSIS — Z79899 Other long term (current) drug therapy: Secondary | ICD-10-CM | POA: Diagnosis not present

## 2012-10-03 DIAGNOSIS — Z125 Encounter for screening for malignant neoplasm of prostate: Secondary | ICD-10-CM | POA: Diagnosis not present

## 2012-10-03 DIAGNOSIS — E7800 Pure hypercholesterolemia, unspecified: Secondary | ICD-10-CM | POA: Diagnosis not present

## 2012-10-04 DIAGNOSIS — I129 Hypertensive chronic kidney disease with stage 1 through stage 4 chronic kidney disease, or unspecified chronic kidney disease: Secondary | ICD-10-CM | POA: Diagnosis not present

## 2012-10-04 DIAGNOSIS — M6281 Muscle weakness (generalized): Secondary | ICD-10-CM | POA: Diagnosis not present

## 2012-10-04 DIAGNOSIS — J9600 Acute respiratory failure, unspecified whether with hypoxia or hypercapnia: Secondary | ICD-10-CM | POA: Diagnosis not present

## 2012-10-04 DIAGNOSIS — I5023 Acute on chronic systolic (congestive) heart failure: Secondary | ICD-10-CM | POA: Diagnosis not present

## 2012-10-04 DIAGNOSIS — N189 Chronic kidney disease, unspecified: Secondary | ICD-10-CM | POA: Diagnosis not present

## 2012-10-04 DIAGNOSIS — F0280 Dementia in other diseases classified elsewhere without behavioral disturbance: Secondary | ICD-10-CM | POA: Diagnosis not present

## 2012-10-07 DIAGNOSIS — E7800 Pure hypercholesterolemia, unspecified: Secondary | ICD-10-CM | POA: Diagnosis not present

## 2012-10-07 DIAGNOSIS — I10 Essential (primary) hypertension: Secondary | ICD-10-CM | POA: Diagnosis not present

## 2012-10-07 DIAGNOSIS — Z006 Encounter for examination for normal comparison and control in clinical research program: Secondary | ICD-10-CM | POA: Diagnosis not present

## 2012-10-07 DIAGNOSIS — L570 Actinic keratosis: Secondary | ICD-10-CM | POA: Diagnosis not present

## 2012-10-07 DIAGNOSIS — Z0000 Encounter for general adult medical examination without abnormal findings: Secondary | ICD-10-CM | POA: Diagnosis not present

## 2012-10-07 DIAGNOSIS — R413 Other amnesia: Secondary | ICD-10-CM | POA: Diagnosis not present

## 2012-10-12 DIAGNOSIS — I10 Essential (primary) hypertension: Secondary | ICD-10-CM | POA: Diagnosis not present

## 2012-10-12 DIAGNOSIS — N189 Chronic kidney disease, unspecified: Secondary | ICD-10-CM | POA: Diagnosis not present

## 2012-10-24 ENCOUNTER — Other Ambulatory Visit: Payer: Self-pay | Admitting: Internal Medicine

## 2012-10-24 NOTE — Telephone Encounter (Signed)
Rx was sent to pharmacy electronically. 

## 2012-10-25 DIAGNOSIS — I10 Essential (primary) hypertension: Secondary | ICD-10-CM | POA: Diagnosis not present

## 2012-11-02 DIAGNOSIS — G47419 Narcolepsy without cataplexy: Secondary | ICD-10-CM | POA: Diagnosis not present

## 2012-11-02 DIAGNOSIS — R413 Other amnesia: Secondary | ICD-10-CM | POA: Diagnosis not present

## 2012-12-08 DIAGNOSIS — G4710 Hypersomnia, unspecified: Secondary | ICD-10-CM | POA: Diagnosis not present

## 2012-12-08 DIAGNOSIS — G47419 Narcolepsy without cataplexy: Secondary | ICD-10-CM | POA: Diagnosis not present

## 2012-12-08 DIAGNOSIS — R413 Other amnesia: Secondary | ICD-10-CM | POA: Diagnosis not present

## 2013-01-07 ENCOUNTER — Other Ambulatory Visit: Payer: Self-pay | Admitting: Internal Medicine

## 2013-01-13 ENCOUNTER — Other Ambulatory Visit: Payer: Self-pay | Admitting: Internal Medicine

## 2013-01-13 NOTE — Telephone Encounter (Signed)
Mr. Partain is no longer a patient of Dr. Rennis Golden. Refer to PCP.

## 2013-01-17 ENCOUNTER — Other Ambulatory Visit: Payer: Self-pay | Admitting: Internal Medicine

## 2013-01-26 DIAGNOSIS — Z23 Encounter for immunization: Secondary | ICD-10-CM | POA: Diagnosis not present

## 2013-01-26 DIAGNOSIS — G47419 Narcolepsy without cataplexy: Secondary | ICD-10-CM | POA: Diagnosis not present

## 2013-01-26 DIAGNOSIS — R5381 Other malaise: Secondary | ICD-10-CM | POA: Diagnosis not present

## 2013-01-26 DIAGNOSIS — R413 Other amnesia: Secondary | ICD-10-CM | POA: Diagnosis not present

## 2013-01-26 DIAGNOSIS — F329 Major depressive disorder, single episode, unspecified: Secondary | ICD-10-CM | POA: Diagnosis not present

## 2013-01-26 DIAGNOSIS — Z79899 Other long term (current) drug therapy: Secondary | ICD-10-CM | POA: Diagnosis not present

## 2013-01-26 DIAGNOSIS — I10 Essential (primary) hypertension: Secondary | ICD-10-CM | POA: Diagnosis not present

## 2013-01-27 ENCOUNTER — Other Ambulatory Visit: Payer: Self-pay | Admitting: Internal Medicine

## 2013-01-27 DIAGNOSIS — I6523 Occlusion and stenosis of bilateral carotid arteries: Secondary | ICD-10-CM

## 2013-02-07 DIAGNOSIS — G47419 Narcolepsy without cataplexy: Secondary | ICD-10-CM | POA: Diagnosis not present

## 2013-02-07 DIAGNOSIS — I259 Chronic ischemic heart disease, unspecified: Secondary | ICD-10-CM | POA: Diagnosis not present

## 2013-02-07 DIAGNOSIS — E7800 Pure hypercholesterolemia, unspecified: Secondary | ICD-10-CM | POA: Diagnosis not present

## 2013-02-07 DIAGNOSIS — I10 Essential (primary) hypertension: Secondary | ICD-10-CM | POA: Diagnosis not present

## 2013-02-09 DIAGNOSIS — R5381 Other malaise: Secondary | ICD-10-CM | POA: Diagnosis not present

## 2013-02-09 DIAGNOSIS — I6529 Occlusion and stenosis of unspecified carotid artery: Secondary | ICD-10-CM | POA: Diagnosis not present

## 2013-02-09 DIAGNOSIS — I10 Essential (primary) hypertension: Secondary | ICD-10-CM | POA: Diagnosis not present

## 2013-02-09 DIAGNOSIS — F329 Major depressive disorder, single episode, unspecified: Secondary | ICD-10-CM | POA: Diagnosis not present

## 2013-02-23 DIAGNOSIS — I6529 Occlusion and stenosis of unspecified carotid artery: Secondary | ICD-10-CM | POA: Diagnosis not present

## 2013-05-08 DIAGNOSIS — H10409 Unspecified chronic conjunctivitis, unspecified eye: Secondary | ICD-10-CM | POA: Diagnosis not present

## 2013-05-08 DIAGNOSIS — H16149 Punctate keratitis, unspecified eye: Secondary | ICD-10-CM | POA: Diagnosis not present

## 2013-05-08 DIAGNOSIS — H43399 Other vitreous opacities, unspecified eye: Secondary | ICD-10-CM | POA: Diagnosis not present

## 2013-05-08 DIAGNOSIS — H26499 Other secondary cataract, unspecified eye: Secondary | ICD-10-CM | POA: Diagnosis not present

## 2013-06-08 DIAGNOSIS — G4710 Hypersomnia, unspecified: Secondary | ICD-10-CM | POA: Diagnosis not present

## 2013-06-08 DIAGNOSIS — R413 Other amnesia: Secondary | ICD-10-CM | POA: Diagnosis not present

## 2013-06-08 DIAGNOSIS — G47419 Narcolepsy without cataplexy: Secondary | ICD-10-CM | POA: Diagnosis not present

## 2013-06-12 DIAGNOSIS — R413 Other amnesia: Secondary | ICD-10-CM | POA: Diagnosis not present

## 2013-06-12 DIAGNOSIS — N189 Chronic kidney disease, unspecified: Secondary | ICD-10-CM | POA: Diagnosis not present

## 2013-06-16 DIAGNOSIS — R5383 Other fatigue: Secondary | ICD-10-CM | POA: Diagnosis not present

## 2013-06-16 DIAGNOSIS — IMO0001 Reserved for inherently not codable concepts without codable children: Secondary | ICD-10-CM | POA: Diagnosis not present

## 2013-06-16 DIAGNOSIS — R059 Cough, unspecified: Secondary | ICD-10-CM | POA: Diagnosis not present

## 2013-06-16 DIAGNOSIS — R05 Cough: Secondary | ICD-10-CM | POA: Diagnosis not present

## 2013-06-16 DIAGNOSIS — G47419 Narcolepsy without cataplexy: Secondary | ICD-10-CM | POA: Diagnosis not present

## 2013-06-16 DIAGNOSIS — A492 Hemophilus influenzae infection, unspecified site: Secondary | ICD-10-CM | POA: Diagnosis not present

## 2013-06-16 DIAGNOSIS — B963 Hemophilus influenzae [H. influenzae] as the cause of diseases classified elsewhere: Secondary | ICD-10-CM | POA: Diagnosis not present

## 2013-06-16 DIAGNOSIS — R5381 Other malaise: Secondary | ICD-10-CM | POA: Diagnosis not present

## 2013-06-16 DIAGNOSIS — R509 Fever, unspecified: Secondary | ICD-10-CM | POA: Diagnosis not present

## 2013-06-17 DIAGNOSIS — R05 Cough: Secondary | ICD-10-CM | POA: Diagnosis not present

## 2013-06-17 DIAGNOSIS — R059 Cough, unspecified: Secondary | ICD-10-CM | POA: Diagnosis not present

## 2013-06-17 DIAGNOSIS — A492 Hemophilus influenzae infection, unspecified site: Secondary | ICD-10-CM | POA: Diagnosis not present

## 2013-06-17 DIAGNOSIS — R509 Fever, unspecified: Secondary | ICD-10-CM | POA: Diagnosis not present

## 2013-06-17 DIAGNOSIS — B963 Hemophilus influenzae [H. influenzae] as the cause of diseases classified elsewhere: Secondary | ICD-10-CM | POA: Diagnosis not present

## 2013-06-17 DIAGNOSIS — G47419 Narcolepsy without cataplexy: Secondary | ICD-10-CM | POA: Diagnosis not present

## 2013-06-18 DIAGNOSIS — G47419 Narcolepsy without cataplexy: Secondary | ICD-10-CM | POA: Diagnosis not present

## 2013-06-18 DIAGNOSIS — B963 Hemophilus influenzae [H. influenzae] as the cause of diseases classified elsewhere: Secondary | ICD-10-CM | POA: Diagnosis not present

## 2013-06-18 DIAGNOSIS — A492 Hemophilus influenzae infection, unspecified site: Secondary | ICD-10-CM | POA: Diagnosis not present

## 2013-06-18 DIAGNOSIS — R05 Cough: Secondary | ICD-10-CM | POA: Diagnosis not present

## 2013-06-18 DIAGNOSIS — R059 Cough, unspecified: Secondary | ICD-10-CM | POA: Diagnosis not present

## 2013-06-18 DIAGNOSIS — R509 Fever, unspecified: Secondary | ICD-10-CM | POA: Diagnosis not present

## 2013-06-19 DIAGNOSIS — R509 Fever, unspecified: Secondary | ICD-10-CM | POA: Diagnosis not present

## 2013-06-19 DIAGNOSIS — R05 Cough: Secondary | ICD-10-CM | POA: Diagnosis not present

## 2013-06-19 DIAGNOSIS — R059 Cough, unspecified: Secondary | ICD-10-CM | POA: Diagnosis not present

## 2013-06-19 DIAGNOSIS — I5023 Acute on chronic systolic (congestive) heart failure: Secondary | ICD-10-CM | POA: Diagnosis present

## 2013-06-19 DIAGNOSIS — I059 Rheumatic mitral valve disease, unspecified: Secondary | ICD-10-CM | POA: Diagnosis not present

## 2013-06-19 DIAGNOSIS — B963 Hemophilus influenzae [H. influenzae] as the cause of diseases classified elsewhere: Secondary | ICD-10-CM | POA: Diagnosis not present

## 2013-06-19 DIAGNOSIS — I10 Essential (primary) hypertension: Secondary | ICD-10-CM | POA: Diagnosis present

## 2013-06-19 DIAGNOSIS — J1100 Influenza due to unidentified influenza virus with unspecified type of pneumonia: Secondary | ICD-10-CM | POA: Diagnosis present

## 2013-06-19 DIAGNOSIS — A492 Hemophilus influenzae infection, unspecified site: Secondary | ICD-10-CM | POA: Diagnosis not present

## 2013-06-19 DIAGNOSIS — I517 Cardiomegaly: Secondary | ICD-10-CM | POA: Diagnosis not present

## 2013-06-19 DIAGNOSIS — J189 Pneumonia, unspecified organism: Secondary | ICD-10-CM | POA: Diagnosis not present

## 2013-06-19 DIAGNOSIS — I2510 Atherosclerotic heart disease of native coronary artery without angina pectoris: Secondary | ICD-10-CM | POA: Diagnosis present

## 2013-06-19 DIAGNOSIS — J9600 Acute respiratory failure, unspecified whether with hypoxia or hypercapnia: Secondary | ICD-10-CM | POA: Diagnosis present

## 2013-06-19 DIAGNOSIS — I252 Old myocardial infarction: Secondary | ICD-10-CM | POA: Diagnosis not present

## 2013-06-19 DIAGNOSIS — J111 Influenza due to unidentified influenza virus with other respiratory manifestations: Secondary | ICD-10-CM | POA: Diagnosis not present

## 2013-06-19 DIAGNOSIS — R262 Difficulty in walking, not elsewhere classified: Secondary | ICD-10-CM | POA: Diagnosis present

## 2013-06-19 DIAGNOSIS — M6281 Muscle weakness (generalized): Secondary | ICD-10-CM | POA: Diagnosis present

## 2013-06-19 DIAGNOSIS — J158 Pneumonia due to other specified bacteria: Secondary | ICD-10-CM | POA: Diagnosis not present

## 2013-06-19 DIAGNOSIS — J438 Other emphysema: Secondary | ICD-10-CM | POA: Diagnosis not present

## 2013-06-19 DIAGNOSIS — I359 Nonrheumatic aortic valve disorder, unspecified: Secondary | ICD-10-CM | POA: Diagnosis not present

## 2013-06-19 DIAGNOSIS — N179 Acute kidney failure, unspecified: Secondary | ICD-10-CM | POA: Diagnosis not present

## 2013-06-19 DIAGNOSIS — G47419 Narcolepsy without cataplexy: Secondary | ICD-10-CM | POA: Diagnosis not present

## 2013-06-19 DIAGNOSIS — R5381 Other malaise: Secondary | ICD-10-CM | POA: Diagnosis present

## 2013-06-19 DIAGNOSIS — R0602 Shortness of breath: Secondary | ICD-10-CM | POA: Diagnosis not present

## 2013-06-19 DIAGNOSIS — Z5189 Encounter for other specified aftercare: Secondary | ICD-10-CM | POA: Diagnosis present

## 2013-06-28 DIAGNOSIS — I509 Heart failure, unspecified: Secondary | ICD-10-CM | POA: Diagnosis not present

## 2013-06-28 DIAGNOSIS — B963 Hemophilus influenzae [H. influenzae] as the cause of diseases classified elsewhere: Secondary | ICD-10-CM | POA: Diagnosis not present

## 2013-06-28 DIAGNOSIS — J029 Acute pharyngitis, unspecified: Secondary | ICD-10-CM | POA: Diagnosis not present

## 2013-06-28 DIAGNOSIS — J189 Pneumonia, unspecified organism: Secondary | ICD-10-CM | POA: Diagnosis not present

## 2013-06-28 DIAGNOSIS — Z5189 Encounter for other specified aftercare: Secondary | ICD-10-CM | POA: Diagnosis present

## 2013-06-28 DIAGNOSIS — G47419 Narcolepsy without cataplexy: Secondary | ICD-10-CM | POA: Diagnosis not present

## 2013-06-28 DIAGNOSIS — K219 Gastro-esophageal reflux disease without esophagitis: Secondary | ICD-10-CM | POA: Diagnosis not present

## 2013-06-28 DIAGNOSIS — M6281 Muscle weakness (generalized): Secondary | ICD-10-CM | POA: Diagnosis present

## 2013-06-28 DIAGNOSIS — J158 Pneumonia due to other specified bacteria: Secondary | ICD-10-CM | POA: Diagnosis not present

## 2013-06-28 DIAGNOSIS — R262 Difficulty in walking, not elsewhere classified: Secondary | ICD-10-CM | POA: Diagnosis present

## 2013-06-28 DIAGNOSIS — I2510 Atherosclerotic heart disease of native coronary artery without angina pectoris: Secondary | ICD-10-CM | POA: Diagnosis not present

## 2013-06-28 DIAGNOSIS — A492 Hemophilus influenzae infection, unspecified site: Secondary | ICD-10-CM | POA: Diagnosis not present

## 2013-06-28 DIAGNOSIS — I10 Essential (primary) hypertension: Secondary | ICD-10-CM | POA: Diagnosis not present

## 2013-06-28 DIAGNOSIS — J111 Influenza due to unidentified influenza virus with other respiratory manifestations: Secondary | ICD-10-CM | POA: Diagnosis not present

## 2013-06-30 DIAGNOSIS — J189 Pneumonia, unspecified organism: Secondary | ICD-10-CM | POA: Diagnosis not present

## 2013-06-30 DIAGNOSIS — K219 Gastro-esophageal reflux disease without esophagitis: Secondary | ICD-10-CM | POA: Diagnosis not present

## 2013-06-30 DIAGNOSIS — I509 Heart failure, unspecified: Secondary | ICD-10-CM | POA: Diagnosis not present

## 2013-07-21 DIAGNOSIS — J029 Acute pharyngitis, unspecified: Secondary | ICD-10-CM | POA: Diagnosis not present

## 2013-07-24 DIAGNOSIS — I10 Essential (primary) hypertension: Secondary | ICD-10-CM | POA: Diagnosis not present

## 2013-07-24 DIAGNOSIS — I509 Heart failure, unspecified: Secondary | ICD-10-CM | POA: Diagnosis not present

## 2013-07-24 DIAGNOSIS — I2510 Atherosclerotic heart disease of native coronary artery without angina pectoris: Secondary | ICD-10-CM | POA: Diagnosis not present

## 2013-07-26 DIAGNOSIS — R413 Other amnesia: Secondary | ICD-10-CM | POA: Diagnosis not present

## 2013-07-26 DIAGNOSIS — I2510 Atherosclerotic heart disease of native coronary artery without angina pectoris: Secondary | ICD-10-CM | POA: Diagnosis not present

## 2013-07-26 DIAGNOSIS — E876 Hypokalemia: Secondary | ICD-10-CM | POA: Diagnosis not present

## 2013-07-26 DIAGNOSIS — I5020 Unspecified systolic (congestive) heart failure: Secondary | ICD-10-CM | POA: Diagnosis not present

## 2013-07-26 DIAGNOSIS — K219 Gastro-esophageal reflux disease without esophagitis: Secondary | ICD-10-CM | POA: Diagnosis not present

## 2013-07-26 DIAGNOSIS — I10 Essential (primary) hypertension: Secondary | ICD-10-CM | POA: Diagnosis not present

## 2013-07-26 DIAGNOSIS — J189 Pneumonia, unspecified organism: Secondary | ICD-10-CM | POA: Diagnosis not present

## 2013-07-28 DIAGNOSIS — E876 Hypokalemia: Secondary | ICD-10-CM | POA: Diagnosis not present

## 2013-07-28 DIAGNOSIS — R413 Other amnesia: Secondary | ICD-10-CM | POA: Diagnosis not present

## 2013-07-28 DIAGNOSIS — I5020 Unspecified systolic (congestive) heart failure: Secondary | ICD-10-CM | POA: Diagnosis not present

## 2013-07-28 DIAGNOSIS — I2510 Atherosclerotic heart disease of native coronary artery without angina pectoris: Secondary | ICD-10-CM | POA: Diagnosis not present

## 2013-07-28 DIAGNOSIS — J189 Pneumonia, unspecified organism: Secondary | ICD-10-CM | POA: Diagnosis not present

## 2013-07-28 DIAGNOSIS — I10 Essential (primary) hypertension: Secondary | ICD-10-CM | POA: Diagnosis not present

## 2013-07-31 DIAGNOSIS — R1311 Dysphagia, oral phase: Secondary | ICD-10-CM | POA: Diagnosis not present

## 2013-07-31 DIAGNOSIS — I6529 Occlusion and stenosis of unspecified carotid artery: Secondary | ICD-10-CM | POA: Diagnosis not present

## 2013-07-31 DIAGNOSIS — I2510 Atherosclerotic heart disease of native coronary artery without angina pectoris: Secondary | ICD-10-CM | POA: Diagnosis not present

## 2013-07-31 DIAGNOSIS — F329 Major depressive disorder, single episode, unspecified: Secondary | ICD-10-CM | POA: Diagnosis not present

## 2013-07-31 DIAGNOSIS — Z23 Encounter for immunization: Secondary | ICD-10-CM | POA: Diagnosis not present

## 2013-07-31 DIAGNOSIS — K8020 Calculus of gallbladder without cholecystitis without obstruction: Secondary | ICD-10-CM | POA: Diagnosis not present

## 2013-07-31 DIAGNOSIS — E7800 Pure hypercholesterolemia, unspecified: Secondary | ICD-10-CM | POA: Diagnosis not present

## 2013-07-31 DIAGNOSIS — I10 Essential (primary) hypertension: Secondary | ICD-10-CM | POA: Diagnosis not present

## 2013-07-31 DIAGNOSIS — E876 Hypokalemia: Secondary | ICD-10-CM | POA: Diagnosis not present

## 2013-08-01 DIAGNOSIS — I10 Essential (primary) hypertension: Secondary | ICD-10-CM | POA: Diagnosis not present

## 2013-08-01 DIAGNOSIS — I5020 Unspecified systolic (congestive) heart failure: Secondary | ICD-10-CM | POA: Diagnosis not present

## 2013-08-01 DIAGNOSIS — J189 Pneumonia, unspecified organism: Secondary | ICD-10-CM | POA: Diagnosis not present

## 2013-08-01 DIAGNOSIS — R413 Other amnesia: Secondary | ICD-10-CM | POA: Diagnosis not present

## 2013-08-01 DIAGNOSIS — E876 Hypokalemia: Secondary | ICD-10-CM | POA: Diagnosis not present

## 2013-08-01 DIAGNOSIS — I2510 Atherosclerotic heart disease of native coronary artery without angina pectoris: Secondary | ICD-10-CM | POA: Diagnosis not present

## 2013-08-02 DIAGNOSIS — I2510 Atherosclerotic heart disease of native coronary artery without angina pectoris: Secondary | ICD-10-CM | POA: Diagnosis not present

## 2013-08-02 DIAGNOSIS — E876 Hypokalemia: Secondary | ICD-10-CM | POA: Diagnosis not present

## 2013-08-02 DIAGNOSIS — I5020 Unspecified systolic (congestive) heart failure: Secondary | ICD-10-CM | POA: Diagnosis not present

## 2013-08-02 DIAGNOSIS — J189 Pneumonia, unspecified organism: Secondary | ICD-10-CM | POA: Diagnosis not present

## 2013-08-02 DIAGNOSIS — R413 Other amnesia: Secondary | ICD-10-CM | POA: Diagnosis not present

## 2013-08-02 DIAGNOSIS — I10 Essential (primary) hypertension: Secondary | ICD-10-CM | POA: Diagnosis not present

## 2013-08-04 DIAGNOSIS — I2510 Atherosclerotic heart disease of native coronary artery without angina pectoris: Secondary | ICD-10-CM | POA: Diagnosis not present

## 2013-08-04 DIAGNOSIS — I5020 Unspecified systolic (congestive) heart failure: Secondary | ICD-10-CM | POA: Diagnosis not present

## 2013-08-04 DIAGNOSIS — J189 Pneumonia, unspecified organism: Secondary | ICD-10-CM | POA: Diagnosis not present

## 2013-08-04 DIAGNOSIS — I10 Essential (primary) hypertension: Secondary | ICD-10-CM | POA: Diagnosis not present

## 2013-08-04 DIAGNOSIS — R413 Other amnesia: Secondary | ICD-10-CM | POA: Diagnosis not present

## 2013-08-04 DIAGNOSIS — E876 Hypokalemia: Secondary | ICD-10-CM | POA: Diagnosis not present

## 2013-08-08 DIAGNOSIS — I5020 Unspecified systolic (congestive) heart failure: Secondary | ICD-10-CM | POA: Diagnosis not present

## 2013-08-08 DIAGNOSIS — E876 Hypokalemia: Secondary | ICD-10-CM | POA: Diagnosis not present

## 2013-08-08 DIAGNOSIS — I10 Essential (primary) hypertension: Secondary | ICD-10-CM | POA: Diagnosis not present

## 2013-08-08 DIAGNOSIS — R413 Other amnesia: Secondary | ICD-10-CM | POA: Diagnosis not present

## 2013-08-08 DIAGNOSIS — I2510 Atherosclerotic heart disease of native coronary artery without angina pectoris: Secondary | ICD-10-CM | POA: Diagnosis not present

## 2013-08-08 DIAGNOSIS — J189 Pneumonia, unspecified organism: Secondary | ICD-10-CM | POA: Diagnosis not present

## 2013-08-09 DIAGNOSIS — E876 Hypokalemia: Secondary | ICD-10-CM | POA: Diagnosis not present

## 2013-08-09 DIAGNOSIS — I10 Essential (primary) hypertension: Secondary | ICD-10-CM | POA: Diagnosis not present

## 2013-08-09 DIAGNOSIS — I5020 Unspecified systolic (congestive) heart failure: Secondary | ICD-10-CM | POA: Diagnosis not present

## 2013-08-09 DIAGNOSIS — J189 Pneumonia, unspecified organism: Secondary | ICD-10-CM | POA: Diagnosis not present

## 2013-08-09 DIAGNOSIS — R413 Other amnesia: Secondary | ICD-10-CM | POA: Diagnosis not present

## 2013-08-09 DIAGNOSIS — I2510 Atherosclerotic heart disease of native coronary artery without angina pectoris: Secondary | ICD-10-CM | POA: Diagnosis not present

## 2013-08-10 DIAGNOSIS — I5020 Unspecified systolic (congestive) heart failure: Secondary | ICD-10-CM | POA: Diagnosis not present

## 2013-08-10 DIAGNOSIS — I10 Essential (primary) hypertension: Secondary | ICD-10-CM | POA: Diagnosis not present

## 2013-08-10 DIAGNOSIS — I2510 Atherosclerotic heart disease of native coronary artery without angina pectoris: Secondary | ICD-10-CM | POA: Diagnosis not present

## 2013-08-10 DIAGNOSIS — J189 Pneumonia, unspecified organism: Secondary | ICD-10-CM | POA: Diagnosis not present

## 2013-08-10 DIAGNOSIS — E876 Hypokalemia: Secondary | ICD-10-CM | POA: Diagnosis not present

## 2013-08-10 DIAGNOSIS — R413 Other amnesia: Secondary | ICD-10-CM | POA: Diagnosis not present

## 2013-08-15 DIAGNOSIS — E876 Hypokalemia: Secondary | ICD-10-CM | POA: Diagnosis not present

## 2013-08-15 DIAGNOSIS — R413 Other amnesia: Secondary | ICD-10-CM | POA: Diagnosis not present

## 2013-08-15 DIAGNOSIS — I10 Essential (primary) hypertension: Secondary | ICD-10-CM | POA: Diagnosis not present

## 2013-08-15 DIAGNOSIS — J189 Pneumonia, unspecified organism: Secondary | ICD-10-CM | POA: Diagnosis not present

## 2013-08-15 DIAGNOSIS — I5020 Unspecified systolic (congestive) heart failure: Secondary | ICD-10-CM | POA: Diagnosis not present

## 2013-08-15 DIAGNOSIS — I2510 Atherosclerotic heart disease of native coronary artery without angina pectoris: Secondary | ICD-10-CM | POA: Diagnosis not present

## 2013-08-22 DIAGNOSIS — E876 Hypokalemia: Secondary | ICD-10-CM | POA: Diagnosis not present

## 2013-08-22 DIAGNOSIS — I2510 Atherosclerotic heart disease of native coronary artery without angina pectoris: Secondary | ICD-10-CM | POA: Diagnosis not present

## 2013-08-22 DIAGNOSIS — I5020 Unspecified systolic (congestive) heart failure: Secondary | ICD-10-CM | POA: Diagnosis not present

## 2013-08-22 DIAGNOSIS — I10 Essential (primary) hypertension: Secondary | ICD-10-CM | POA: Diagnosis not present

## 2013-08-22 DIAGNOSIS — R413 Other amnesia: Secondary | ICD-10-CM | POA: Diagnosis not present

## 2013-08-22 DIAGNOSIS — J189 Pneumonia, unspecified organism: Secondary | ICD-10-CM | POA: Diagnosis not present

## 2013-08-29 DIAGNOSIS — I2510 Atherosclerotic heart disease of native coronary artery without angina pectoris: Secondary | ICD-10-CM | POA: Diagnosis not present

## 2013-08-29 DIAGNOSIS — I6529 Occlusion and stenosis of unspecified carotid artery: Secondary | ICD-10-CM | POA: Diagnosis not present

## 2013-08-29 DIAGNOSIS — E876 Hypokalemia: Secondary | ICD-10-CM | POA: Diagnosis not present

## 2013-08-29 DIAGNOSIS — K219 Gastro-esophageal reflux disease without esophagitis: Secondary | ICD-10-CM | POA: Diagnosis not present

## 2013-08-29 DIAGNOSIS — R413 Other amnesia: Secondary | ICD-10-CM | POA: Diagnosis not present

## 2013-08-29 DIAGNOSIS — I10 Essential (primary) hypertension: Secondary | ICD-10-CM | POA: Diagnosis not present

## 2013-08-29 DIAGNOSIS — F329 Major depressive disorder, single episode, unspecified: Secondary | ICD-10-CM | POA: Diagnosis not present

## 2013-08-29 DIAGNOSIS — J189 Pneumonia, unspecified organism: Secondary | ICD-10-CM | POA: Diagnosis not present

## 2013-08-29 DIAGNOSIS — I5020 Unspecified systolic (congestive) heart failure: Secondary | ICD-10-CM | POA: Diagnosis not present

## 2013-08-29 DIAGNOSIS — E7800 Pure hypercholesterolemia, unspecified: Secondary | ICD-10-CM | POA: Diagnosis not present

## 2013-09-05 DIAGNOSIS — R413 Other amnesia: Secondary | ICD-10-CM | POA: Diagnosis not present

## 2013-09-05 DIAGNOSIS — I5020 Unspecified systolic (congestive) heart failure: Secondary | ICD-10-CM | POA: Diagnosis not present

## 2013-09-05 DIAGNOSIS — I2510 Atherosclerotic heart disease of native coronary artery without angina pectoris: Secondary | ICD-10-CM | POA: Diagnosis not present

## 2013-09-05 DIAGNOSIS — J189 Pneumonia, unspecified organism: Secondary | ICD-10-CM | POA: Diagnosis not present

## 2013-09-05 DIAGNOSIS — I10 Essential (primary) hypertension: Secondary | ICD-10-CM | POA: Diagnosis not present

## 2013-09-05 DIAGNOSIS — E876 Hypokalemia: Secondary | ICD-10-CM | POA: Diagnosis not present

## 2013-10-20 DIAGNOSIS — Z7982 Long term (current) use of aspirin: Secondary | ICD-10-CM | POA: Diagnosis not present

## 2013-10-20 DIAGNOSIS — Z125 Encounter for screening for malignant neoplasm of prostate: Secondary | ICD-10-CM | POA: Diagnosis not present

## 2013-10-20 DIAGNOSIS — R197 Diarrhea, unspecified: Secondary | ICD-10-CM | POA: Diagnosis not present

## 2013-10-20 DIAGNOSIS — E7800 Pure hypercholesterolemia, unspecified: Secondary | ICD-10-CM | POA: Diagnosis not present

## 2013-10-20 DIAGNOSIS — Z0000 Encounter for general adult medical examination without abnormal findings: Secondary | ICD-10-CM | POA: Diagnosis not present

## 2013-10-20 DIAGNOSIS — R413 Other amnesia: Secondary | ICD-10-CM | POA: Diagnosis not present

## 2013-10-20 DIAGNOSIS — K219 Gastro-esophageal reflux disease without esophagitis: Secondary | ICD-10-CM | POA: Diagnosis not present

## 2013-10-20 DIAGNOSIS — I10 Essential (primary) hypertension: Secondary | ICD-10-CM | POA: Diagnosis not present

## 2013-10-24 DIAGNOSIS — R197 Diarrhea, unspecified: Secondary | ICD-10-CM | POA: Diagnosis not present

## 2013-11-07 DIAGNOSIS — R197 Diarrhea, unspecified: Secondary | ICD-10-CM | POA: Diagnosis not present

## 2013-11-07 DIAGNOSIS — Z79899 Other long term (current) drug therapy: Secondary | ICD-10-CM | POA: Diagnosis not present

## 2013-11-07 DIAGNOSIS — R894 Abnormal immunological findings in specimens from other organs, systems and tissues: Secondary | ICD-10-CM | POA: Diagnosis not present

## 2013-11-13 DIAGNOSIS — R894 Abnormal immunological findings in specimens from other organs, systems and tissues: Secondary | ICD-10-CM | POA: Diagnosis not present

## 2013-11-17 DIAGNOSIS — R197 Diarrhea, unspecified: Secondary | ICD-10-CM | POA: Diagnosis not present

## 2013-11-17 DIAGNOSIS — A048 Other specified bacterial intestinal infections: Secondary | ICD-10-CM | POA: Diagnosis not present

## 2013-12-06 DIAGNOSIS — R413 Other amnesia: Secondary | ICD-10-CM | POA: Diagnosis not present

## 2013-12-06 DIAGNOSIS — I658 Occlusion and stenosis of other precerebral arteries: Secondary | ICD-10-CM | POA: Diagnosis not present

## 2013-12-06 DIAGNOSIS — G47419 Narcolepsy without cataplexy: Secondary | ICD-10-CM | POA: Diagnosis not present

## 2013-12-06 DIAGNOSIS — I669 Occlusion and stenosis of unspecified cerebral artery: Secondary | ICD-10-CM | POA: Diagnosis not present

## 2013-12-18 DIAGNOSIS — R197 Diarrhea, unspecified: Secondary | ICD-10-CM | POA: Diagnosis not present

## 2013-12-18 DIAGNOSIS — R634 Abnormal weight loss: Secondary | ICD-10-CM | POA: Diagnosis not present

## 2014-01-04 DIAGNOSIS — R809 Proteinuria, unspecified: Secondary | ICD-10-CM | POA: Diagnosis not present

## 2014-01-04 DIAGNOSIS — N390 Urinary tract infection, site not specified: Secondary | ICD-10-CM | POA: Diagnosis not present

## 2014-01-04 DIAGNOSIS — I10 Essential (primary) hypertension: Secondary | ICD-10-CM | POA: Diagnosis not present

## 2014-01-04 DIAGNOSIS — E559 Vitamin D deficiency, unspecified: Secondary | ICD-10-CM | POA: Diagnosis not present

## 2014-01-04 DIAGNOSIS — N183 Chronic kidney disease, stage 3 unspecified: Secondary | ICD-10-CM | POA: Diagnosis not present

## 2014-01-18 DIAGNOSIS — IMO0002 Reserved for concepts with insufficient information to code with codable children: Secondary | ICD-10-CM | POA: Diagnosis not present

## 2014-01-18 DIAGNOSIS — Z85828 Personal history of other malignant neoplasm of skin: Secondary | ICD-10-CM | POA: Diagnosis not present

## 2014-01-18 DIAGNOSIS — L570 Actinic keratosis: Secondary | ICD-10-CM | POA: Diagnosis not present

## 2014-01-30 IMAGING — CR DG CHEST 2V
2 series · 2 of 2 positions shown · non-contrast
Comparison: 09/01/2011

CLINICAL DATA: Shortness of breath, former smoker, weakness,
hypertension, coronary artery disease

CHEST - 2 VIEW

[x chest ap]
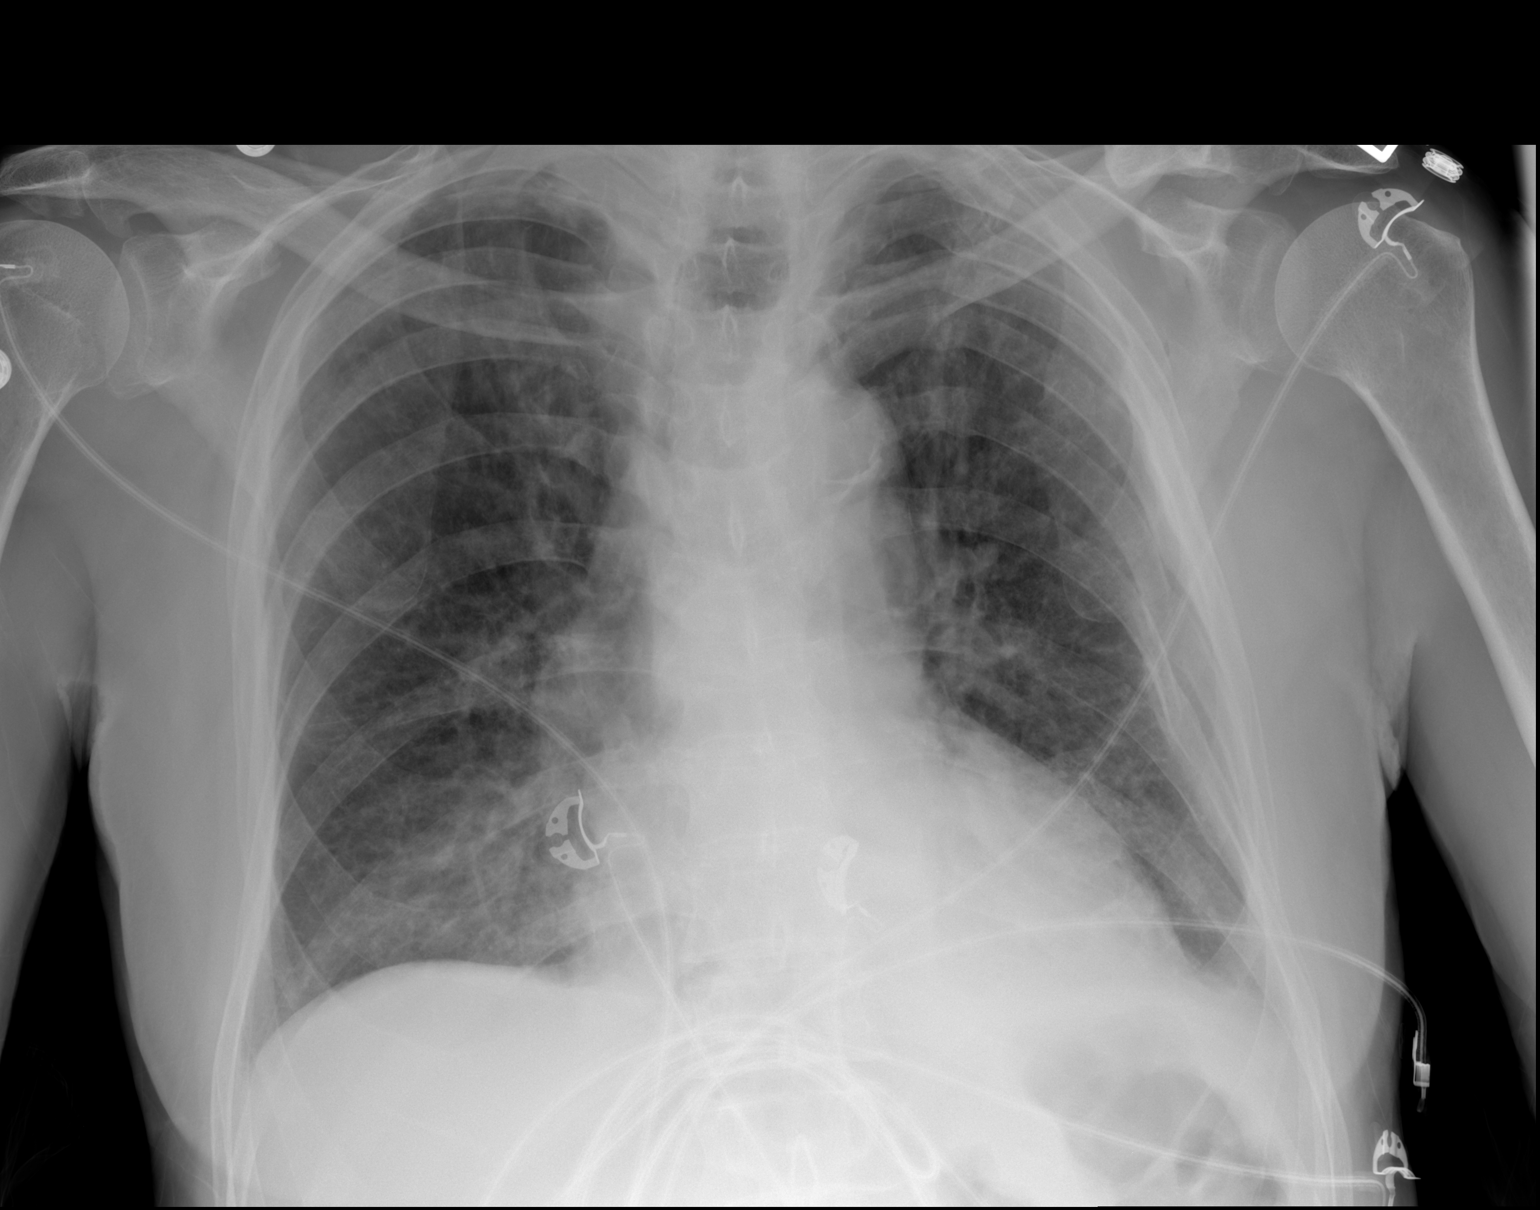

[w chest lat]
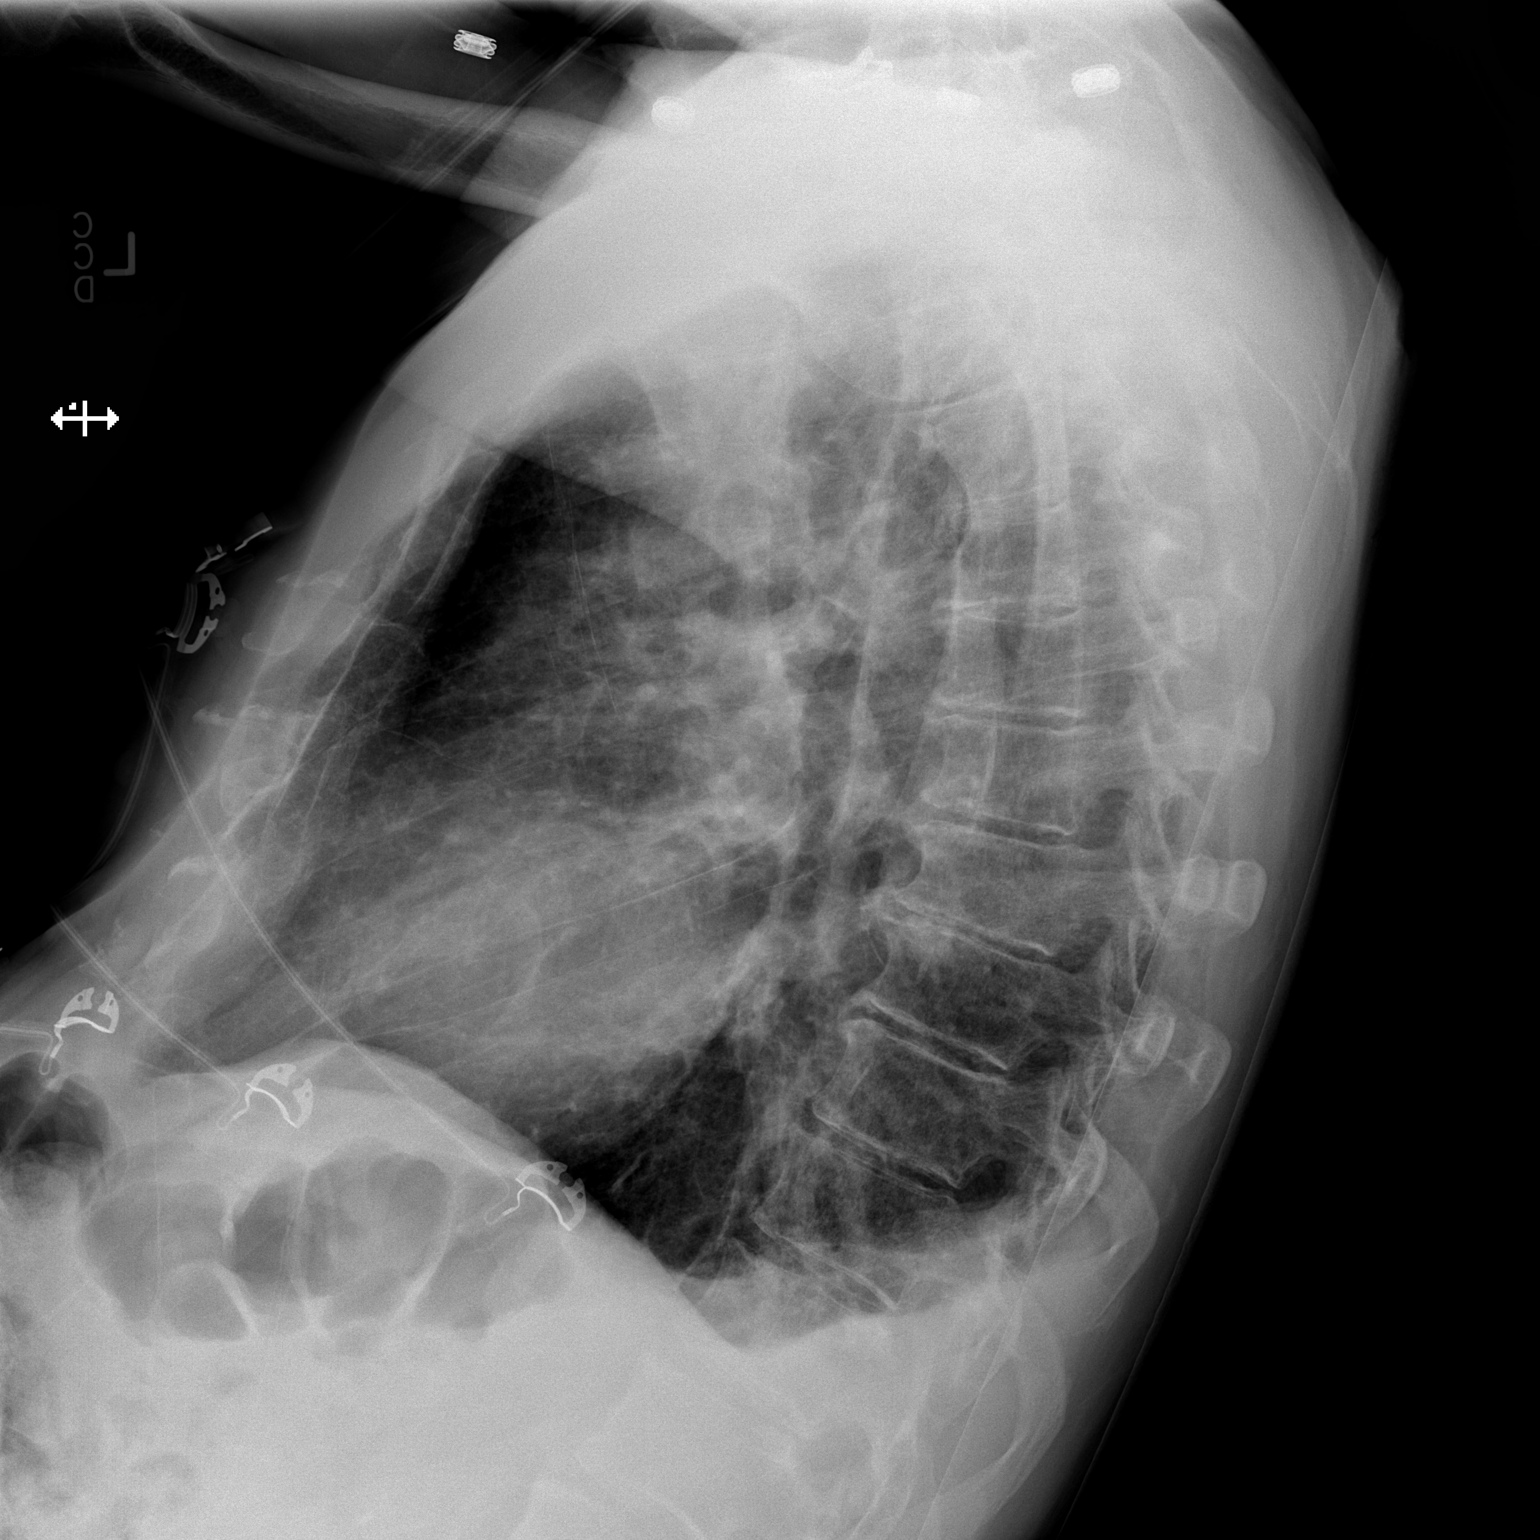

[2 of 2 positions shown; findings below may reference images not displayed]

FINDINGS: Enlargement of cardiac silhouette.
Pulmonary vascular congestion.
Atherosclerotic calcification of mildly tortuous thoracic aorta.
Mild chronic interstitial prominence with new bibasilar infiltrates
which could reflect edema or infection.
Small bibasilar pleural effusions.
Underlying emphysematous changes without pneumothorax.
Multiple old left rib fractures.
Osseous demineralization.
IMPRESSION: Enlargement cardiac silhouette with pulmonary vascular congestion
and new basilar infiltrates with associated pleural effusions,
question mild CHF versus infection.
Underlying emphysematous and chronic interstitial lung disease
changes.

## 2014-02-16 DIAGNOSIS — Z23 Encounter for immunization: Secondary | ICD-10-CM | POA: Diagnosis not present

## 2014-06-12 DIAGNOSIS — R197 Diarrhea, unspecified: Secondary | ICD-10-CM | POA: Diagnosis not present

## 2014-09-05 DIAGNOSIS — G4710 Hypersomnia, unspecified: Secondary | ICD-10-CM | POA: Diagnosis not present

## 2014-09-05 DIAGNOSIS — G47419 Narcolepsy without cataplexy: Secondary | ICD-10-CM | POA: Diagnosis not present

## 2014-09-05 DIAGNOSIS — R413 Other amnesia: Secondary | ICD-10-CM | POA: Diagnosis not present

## 2014-10-01 DIAGNOSIS — K219 Gastro-esophageal reflux disease without esophagitis: Secondary | ICD-10-CM | POA: Diagnosis not present

## 2014-10-01 DIAGNOSIS — F068 Other specified mental disorders due to known physiological condition: Secondary | ICD-10-CM | POA: Diagnosis not present

## 2014-10-01 DIAGNOSIS — R197 Diarrhea, unspecified: Secondary | ICD-10-CM | POA: Diagnosis not present

## 2014-12-10 DIAGNOSIS — Z79899 Other long term (current) drug therapy: Secondary | ICD-10-CM | POA: Diagnosis not present

## 2014-12-10 DIAGNOSIS — E780 Pure hypercholesterolemia: Secondary | ICD-10-CM | POA: Diagnosis not present

## 2014-12-10 DIAGNOSIS — I10 Essential (primary) hypertension: Secondary | ICD-10-CM | POA: Diagnosis not present

## 2014-12-10 DIAGNOSIS — E559 Vitamin D deficiency, unspecified: Secondary | ICD-10-CM | POA: Diagnosis not present

## 2014-12-11 IMAGING — CR DG CHEST 2V
2 series · 2 of 2 positions shown · non-contrast
Comparison: Two-view chest x-ray 11/23/2011, 09/01/2011 and
01/06/2007 [REDACTED].

CLINICAL DATA: Generalized weakness.  Hiccups.  Chest discomfort.

CHEST - 2 VIEW

[w chest lat]
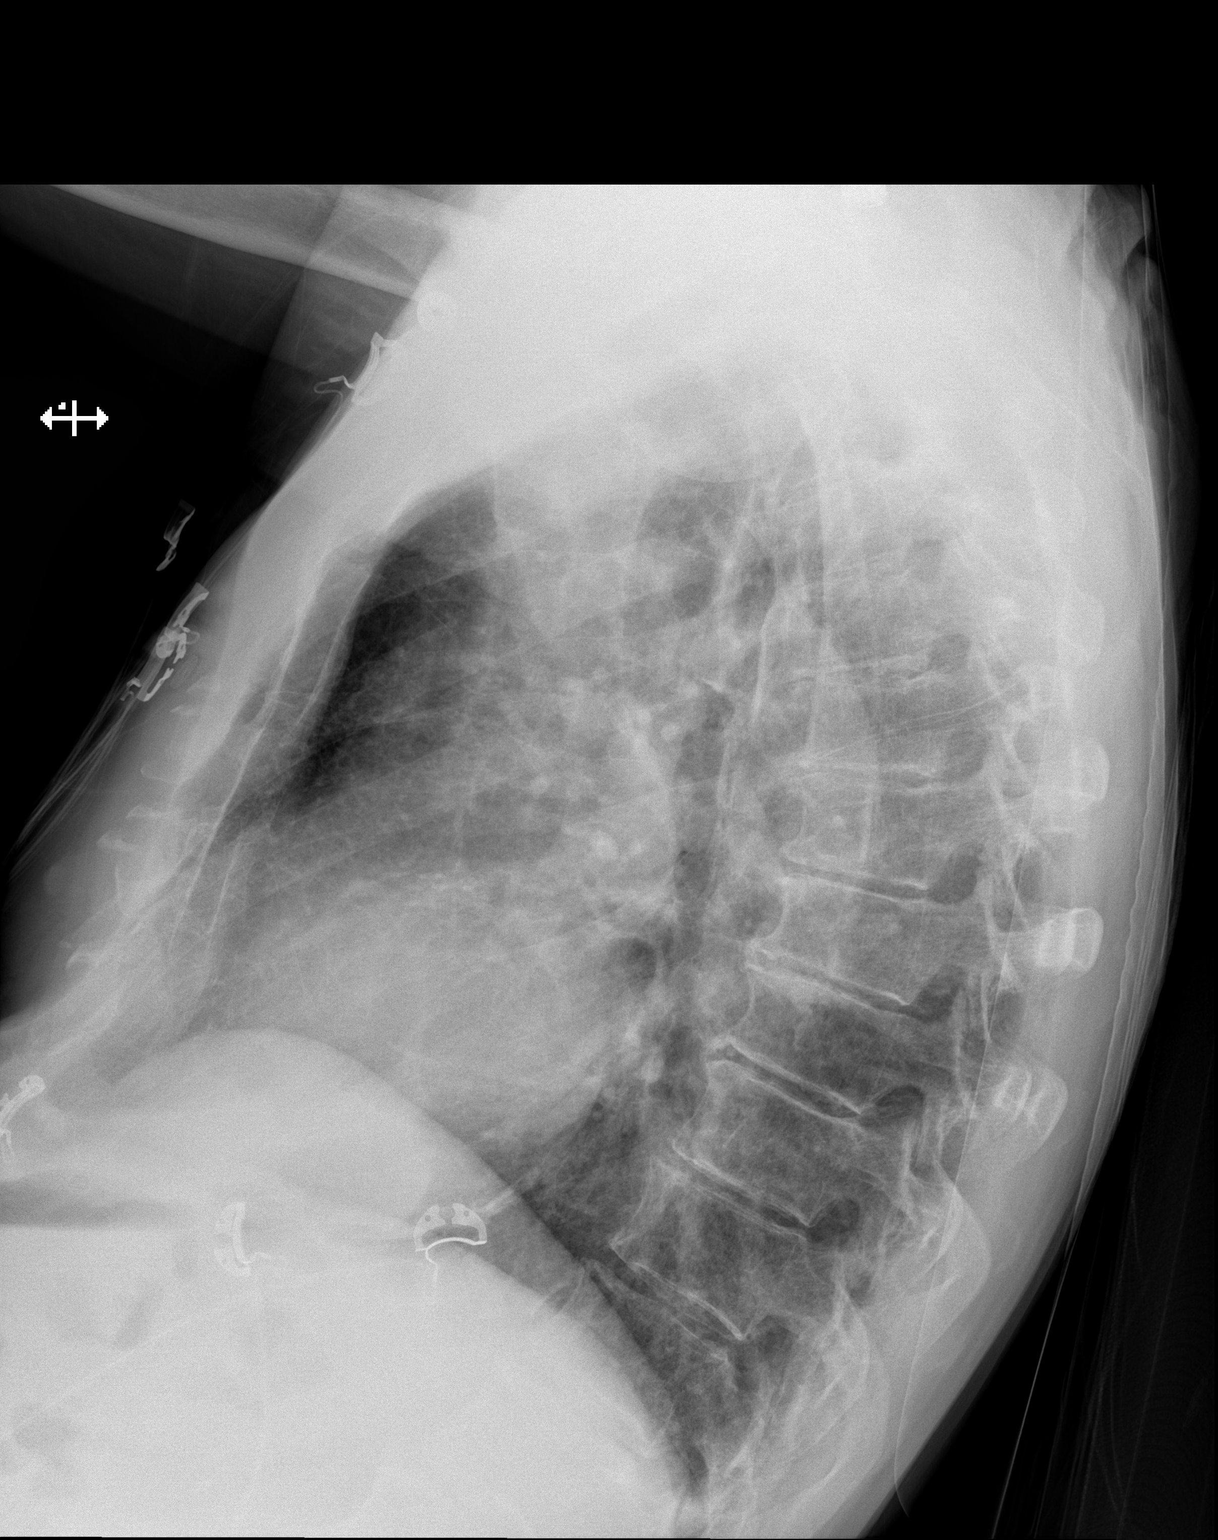

[x chest ap]
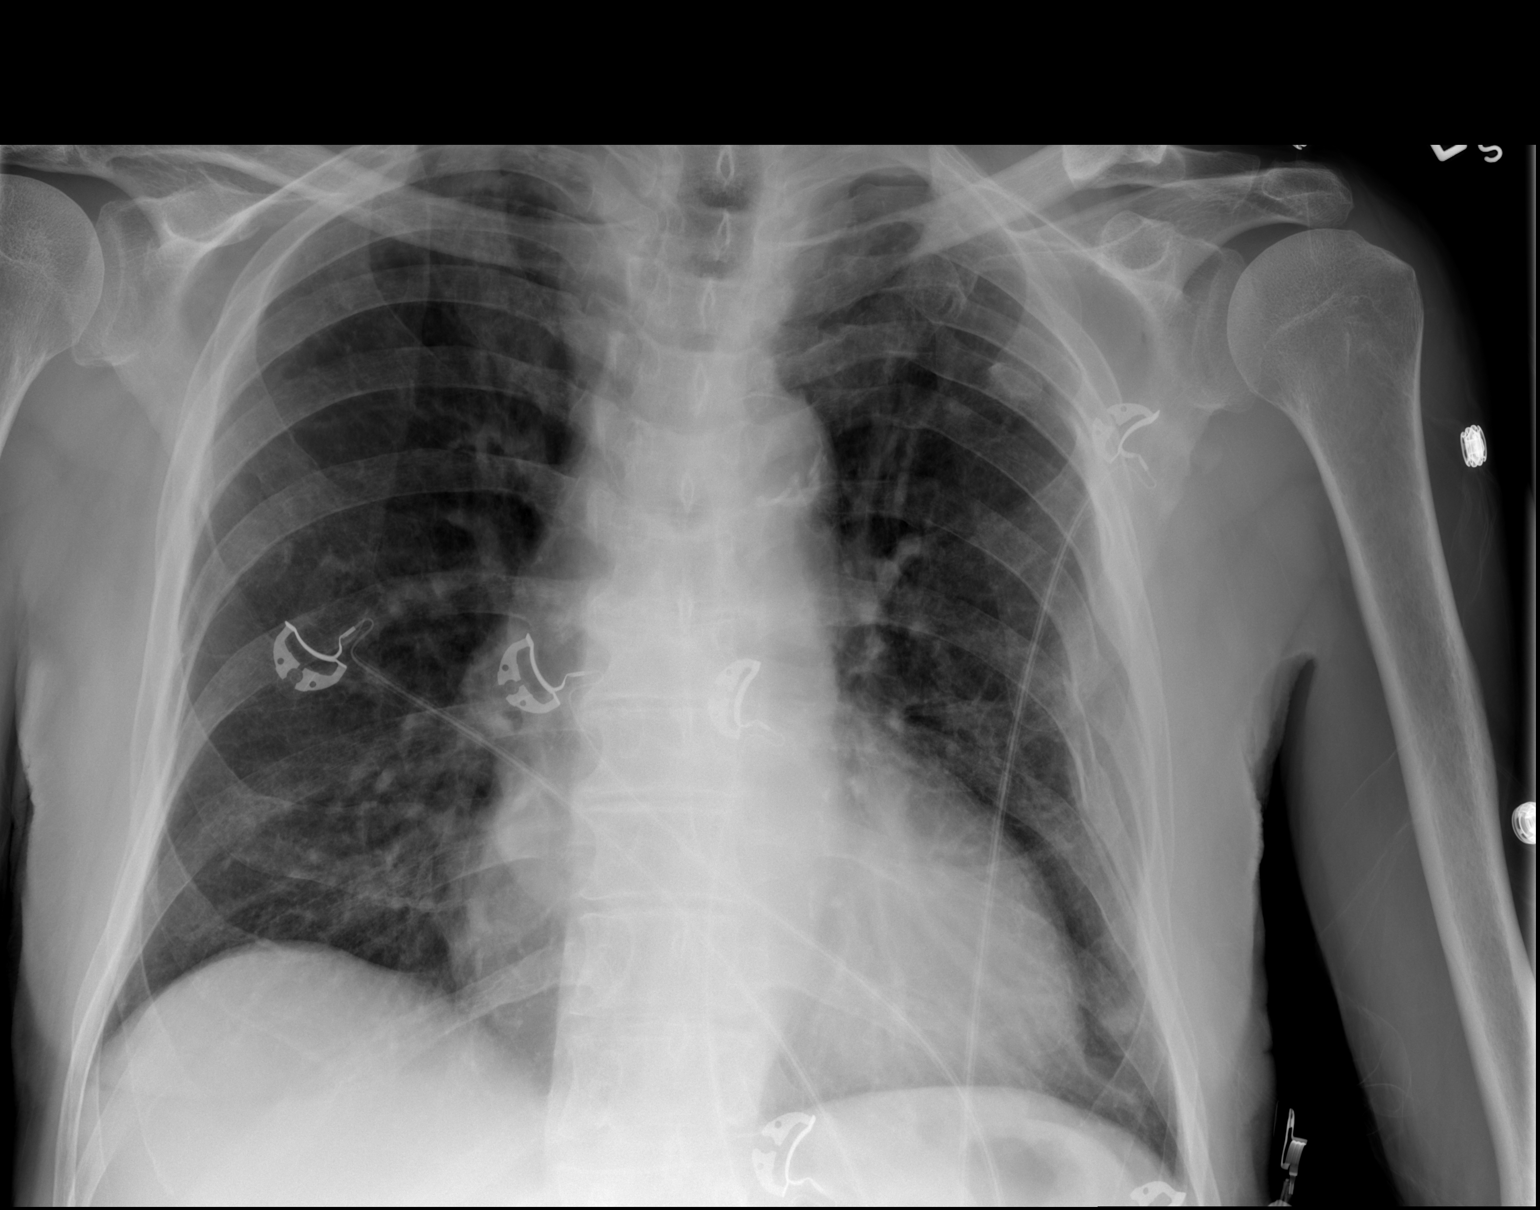

[2 of 2 positions shown; findings below may reference images not displayed]

FINDINGS: Cardiac silhouette mildly enlarged but stable.  Thoracic
aorta atherosclerotic, unchanged.  Hilar and mediastinal contours
otherwise unremarkable.  Hyperinflation, unchanged.  Lungs clear.
Bronchovascular markings normal.  Pulmonary vascularity normal.  No
pneumothorax.  No pleural effusions.  Old healed fracture involving
the distal left clavicle and degenerative changes throughout the
thoracic spine.
IMPRESSION: Stable COPD/emphysema and stable mild cardiomegaly.  No acute
cardiopulmonary disease.

## 2014-12-11 IMAGING — CT CT ABD-PELV W/ CM
1 of 3 series · 14 of 32 positions shown, 19 images · IV contrast (omnipaque)
Comparison: None.

CLINICAL DATA: Vomiting since last night.  Generalized weakness and
hiccups.  Shortness of breath.

CT ABDOMEN AND PELVIS WITH CONTRAST
TECHNIQUE: Multidetector CT imaging of the abdomen and pelvis was
performed following the standard protocol during bolus
administration of intravenous contrast.
Contrast: 80mL OMNIPAQUE IOHEXOL 300 MG/ML  SOLN

[Series 3: abd/pel with · axial · 0.79mm/px · z∈[+1111,+1491]mm · 14 of 86 slices shown, 19 images]
[im 5/86  soft-tissue]
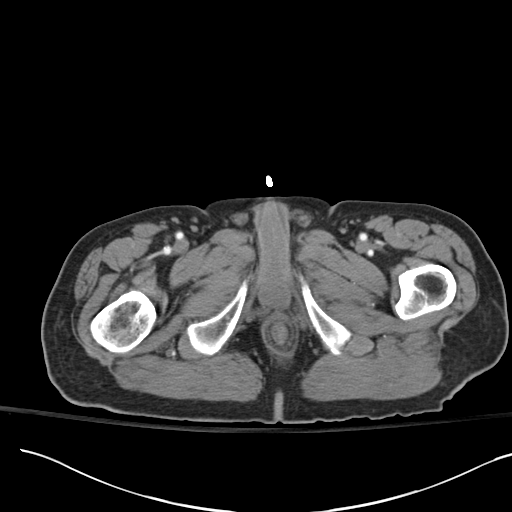
[im 5/86  bone]
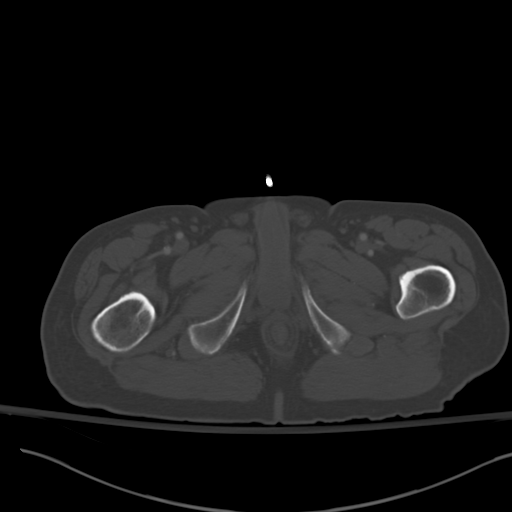
[im 13/86  soft-tissue]
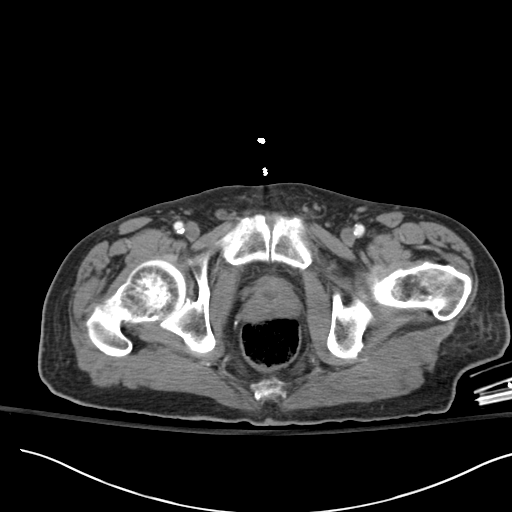
[im 18/86  soft-tissue]
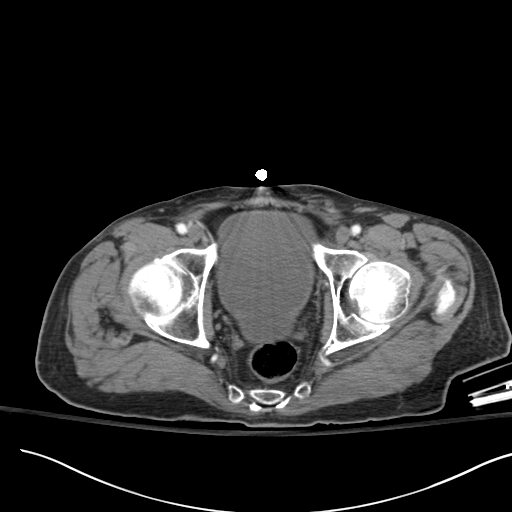
[im 26/86  soft-tissue]
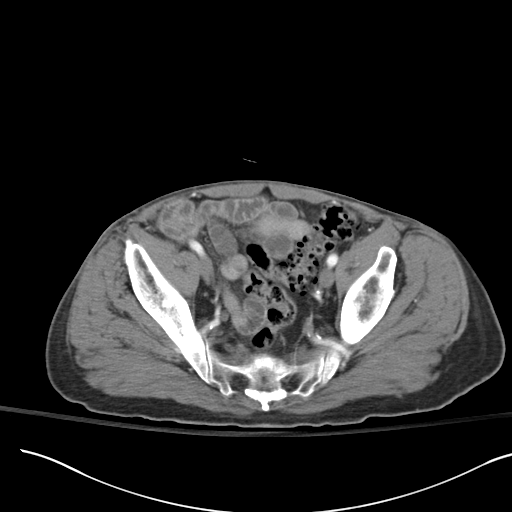
[im 30/86  soft-tissue]
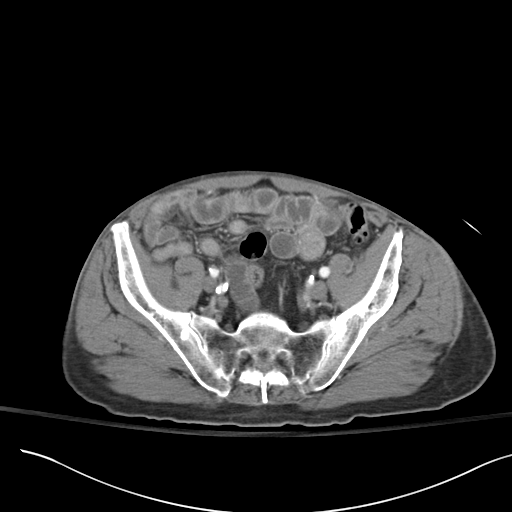
[im 39/86  soft-tissue]
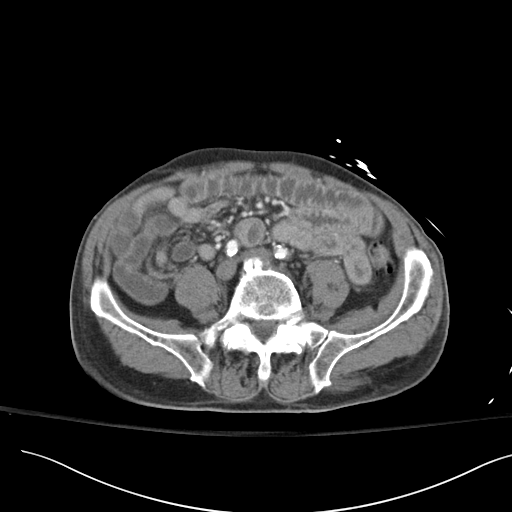
[im 43/86  soft-tissue]
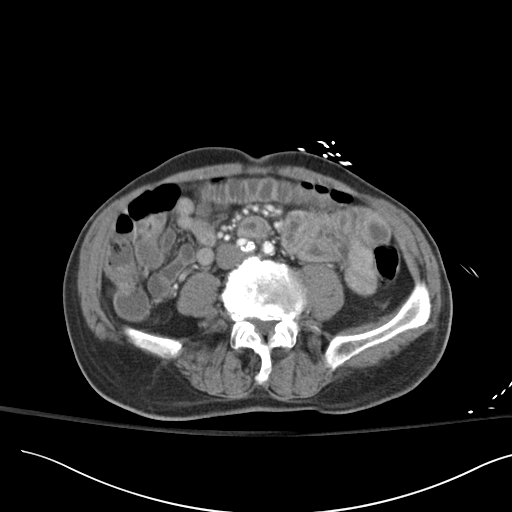
[im 47/86  soft-tissue]
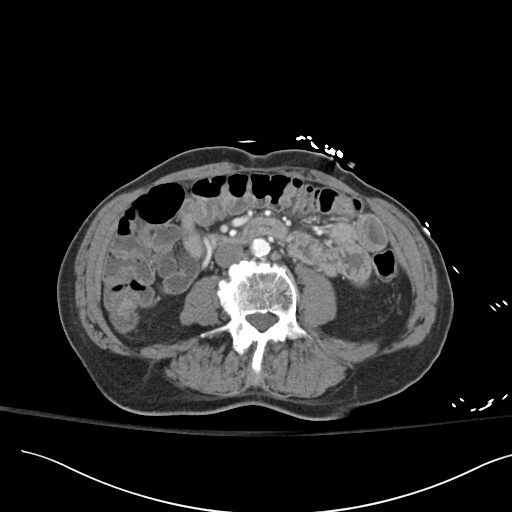
[im 56/86  soft-tissue]
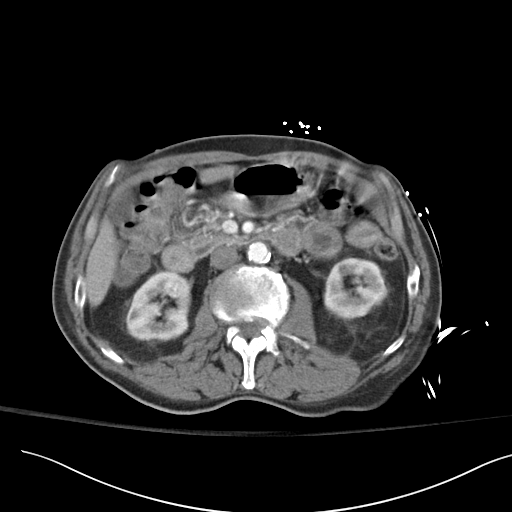
[im 56/86  bone]
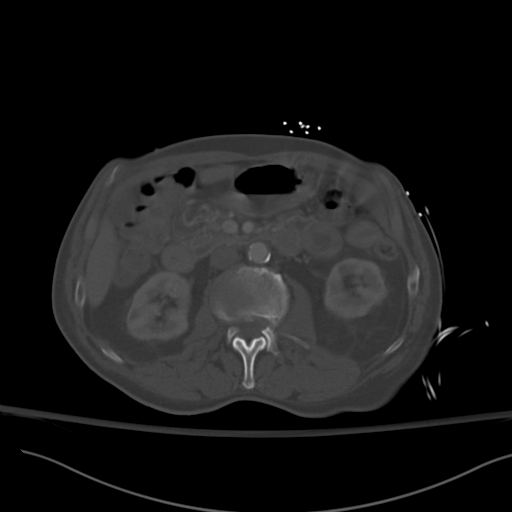
[im 60/86  soft-tissue]
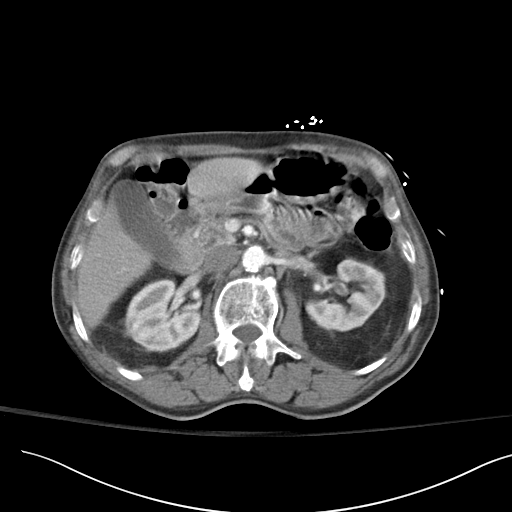
[im 69/86  soft-tissue]
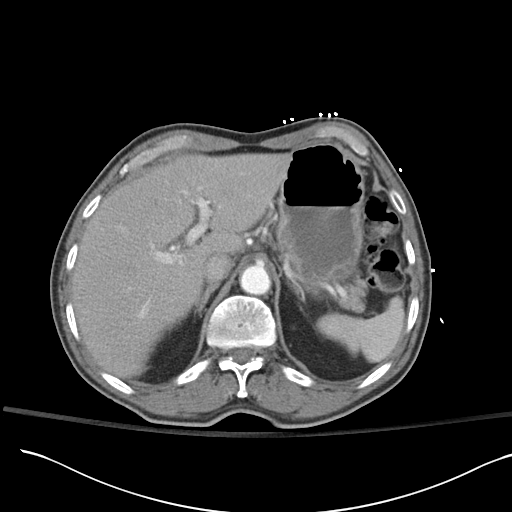
[im 69/86  lung]
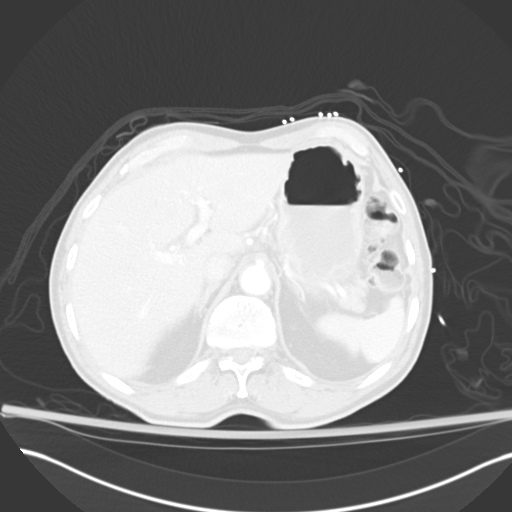
[im 73/86  soft-tissue]
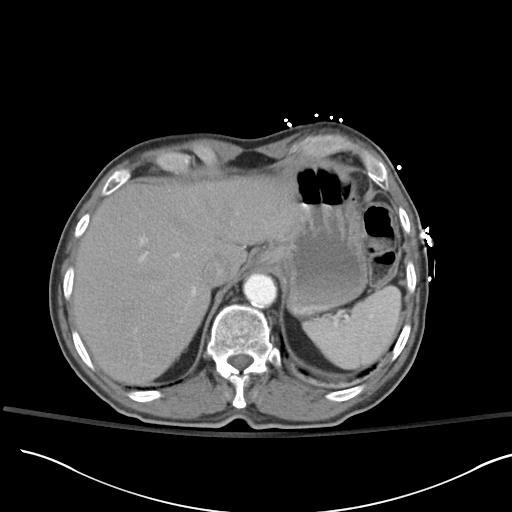
[im 73/86  lung]
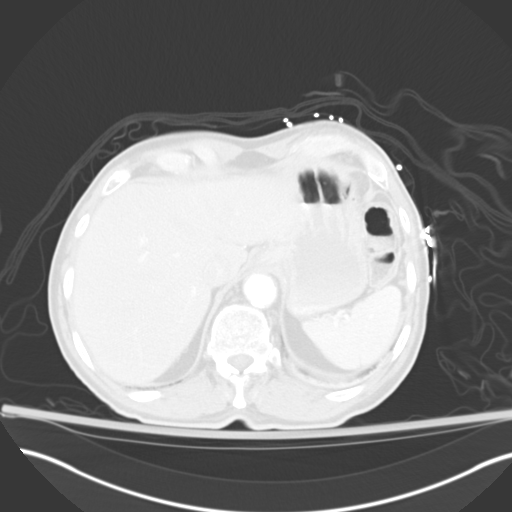
[im 77/86  lung]
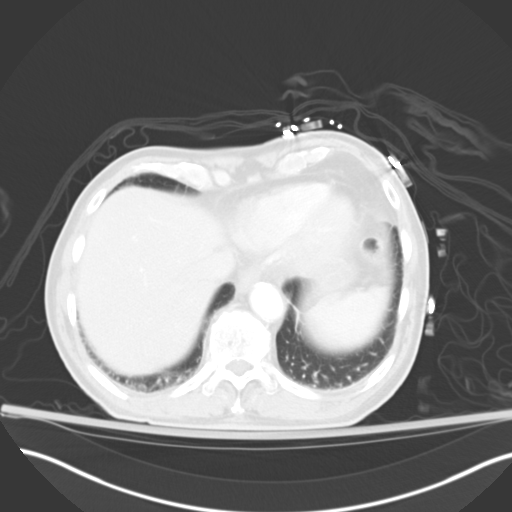
[im 81/86  soft-tissue]
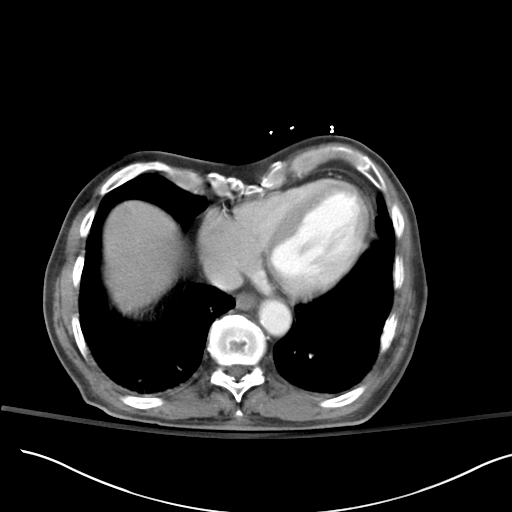
[im 81/86  lung]
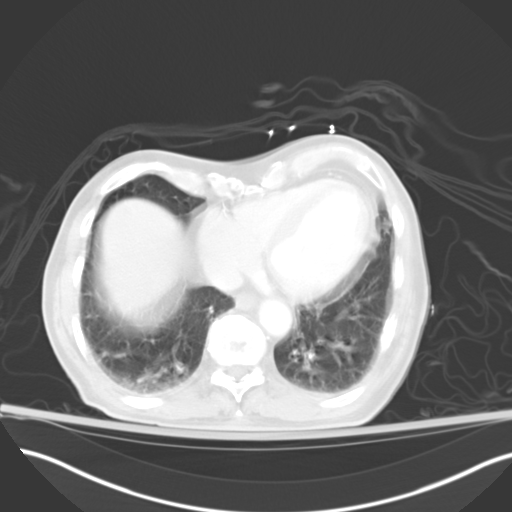

[14 of 32 positions shown; findings below may reference images not displayed]

FINDINGS: Technically limited study due to motion artifact.

Dependent atelectasis and motion artifact in the lung bases.
Pectus excavatum.  Cardiac enlargement.

Mild diffuse fatty infiltration of the liver.  No focal liver
lesions.  Cholelithiasis with single gallstone in the dependent
portion of the gallbladder.  No gallbladder wall thickening or
edema.  The pancreas is atrophic.  Spleen size is normal.  No
adrenal gland nodules.  No solid mass or hydronephrosis in either
kidney.  The stomach is not abnormally distended.  Small bowel are
not distended but there is suggestion of wall thickening of left
upper quadrant jejunal loops.  This may represent gastroenteritis
or inflammatory process.  Distal small bowel are unremarkable.
Colon is fluid-filled consistent with liquid stool.  No colonic
distension.  No free air or free fluid in the abdomen.

Pelvis:  Prostate gland does not appear enlarged.  Bladder wall is
not thickened.  No free or loculated pelvic fluid collections.  No
pelvic lymphadenopathy.  Appendix is not identified.  No
diverticulitis despite multiple sigmoid diverticula.  Degenerative
changes throughout the lumbar spine.  No destructive bone lesions.
IMPRESSION: Cholelithiasis.  Suggestion of small bowel wall thickening
involving the jejunum and liquid stool in the colon.  Changes
likely to represent gastroenteritis or other inflammatory process.
Mild fatty infiltration of the liver.

## 2014-12-14 DIAGNOSIS — R197 Diarrhea, unspecified: Secondary | ICD-10-CM | POA: Diagnosis not present

## 2014-12-14 DIAGNOSIS — F329 Major depressive disorder, single episode, unspecified: Secondary | ICD-10-CM | POA: Diagnosis not present

## 2014-12-14 DIAGNOSIS — E780 Pure hypercholesterolemia: Secondary | ICD-10-CM | POA: Diagnosis not present

## 2014-12-14 DIAGNOSIS — I10 Essential (primary) hypertension: Secondary | ICD-10-CM | POA: Diagnosis not present

## 2014-12-14 DIAGNOSIS — Z1212 Encounter for screening for malignant neoplasm of rectum: Secondary | ICD-10-CM | POA: Diagnosis not present

## 2015-01-14 DIAGNOSIS — R197 Diarrhea, unspecified: Secondary | ICD-10-CM | POA: Diagnosis not present

## 2015-02-11 DIAGNOSIS — R197 Diarrhea, unspecified: Secondary | ICD-10-CM | POA: Diagnosis not present

## 2015-03-06 DIAGNOSIS — R413 Other amnesia: Secondary | ICD-10-CM | POA: Diagnosis not present

## 2015-03-06 DIAGNOSIS — G4710 Hypersomnia, unspecified: Secondary | ICD-10-CM | POA: Diagnosis not present

## 2015-03-06 DIAGNOSIS — G47419 Narcolepsy without cataplexy: Secondary | ICD-10-CM | POA: Diagnosis not present

## 2015-03-06 DIAGNOSIS — I669 Occlusion and stenosis of unspecified cerebral artery: Secondary | ICD-10-CM | POA: Diagnosis not present

## 2015-03-16 DIAGNOSIS — R05 Cough: Secondary | ICD-10-CM | POA: Diagnosis not present

## 2015-03-16 DIAGNOSIS — E860 Dehydration: Secondary | ICD-10-CM | POA: Diagnosis not present

## 2015-03-16 DIAGNOSIS — Z87891 Personal history of nicotine dependence: Secondary | ICD-10-CM | POA: Diagnosis not present

## 2015-03-16 DIAGNOSIS — R404 Transient alteration of awareness: Secondary | ICD-10-CM | POA: Diagnosis not present

## 2015-03-16 DIAGNOSIS — R55 Syncope and collapse: Secondary | ICD-10-CM | POA: Diagnosis not present

## 2015-03-16 DIAGNOSIS — R5383 Other fatigue: Secondary | ICD-10-CM | POA: Diagnosis not present

## 2015-09-04 DIAGNOSIS — G47419 Narcolepsy without cataplexy: Secondary | ICD-10-CM | POA: Diagnosis not present

## 2015-09-04 DIAGNOSIS — G4710 Hypersomnia, unspecified: Secondary | ICD-10-CM | POA: Diagnosis not present

## 2015-09-04 DIAGNOSIS — R413 Other amnesia: Secondary | ICD-10-CM | POA: Diagnosis not present

## 2016-01-17 DIAGNOSIS — S61411A Laceration without foreign body of right hand, initial encounter: Secondary | ICD-10-CM | POA: Diagnosis not present

## 2016-01-17 DIAGNOSIS — Z87891 Personal history of nicotine dependence: Secondary | ICD-10-CM | POA: Diagnosis not present

## 2016-01-17 DIAGNOSIS — Z8673 Personal history of transient ischemic attack (TIA), and cerebral infarction without residual deficits: Secondary | ICD-10-CM | POA: Diagnosis not present

## 2016-01-17 DIAGNOSIS — R413 Other amnesia: Secondary | ICD-10-CM | POA: Diagnosis not present

## 2016-01-17 DIAGNOSIS — G4733 Obstructive sleep apnea (adult) (pediatric): Secondary | ICD-10-CM | POA: Diagnosis not present

## 2016-01-30 ENCOUNTER — Other Ambulatory Visit (HOSPITAL_BASED_OUTPATIENT_CLINIC_OR_DEPARTMENT_OTHER): Payer: Self-pay | Admitting: Internal Medicine

## 2016-01-30 DIAGNOSIS — R197 Diarrhea, unspecified: Secondary | ICD-10-CM | POA: Diagnosis not present

## 2016-01-30 DIAGNOSIS — F329 Major depressive disorder, single episode, unspecified: Secondary | ICD-10-CM | POA: Diagnosis not present

## 2016-01-30 DIAGNOSIS — S0990XD Unspecified injury of head, subsequent encounter: Secondary | ICD-10-CM

## 2016-01-30 DIAGNOSIS — I10 Essential (primary) hypertension: Secondary | ICD-10-CM | POA: Diagnosis not present

## 2016-01-30 DIAGNOSIS — R413 Other amnesia: Secondary | ICD-10-CM | POA: Diagnosis not present

## 2016-01-30 DIAGNOSIS — Z23 Encounter for immunization: Secondary | ICD-10-CM | POA: Diagnosis not present

## 2016-02-06 ENCOUNTER — Ambulatory Visit (HOSPITAL_BASED_OUTPATIENT_CLINIC_OR_DEPARTMENT_OTHER)
Admission: RE | Admit: 2016-02-06 | Discharge: 2016-02-06 | Disposition: A | Discharge status: Home / Self Care | Payer: Medicare Other | Source: Ambulatory Visit | Attending: Internal Medicine | Admitting: Internal Medicine

## 2016-02-06 DIAGNOSIS — R51 Headache: Secondary | ICD-10-CM | POA: Diagnosis not present

## 2016-02-06 DIAGNOSIS — W19XXXD Unspecified fall, subsequent encounter: Secondary | ICD-10-CM | POA: Insufficient documentation

## 2016-02-06 DIAGNOSIS — S0990XD Unspecified injury of head, subsequent encounter: Secondary | ICD-10-CM | POA: Diagnosis not present

## 2016-02-06 DIAGNOSIS — S0990XA Unspecified injury of head, initial encounter: Secondary | ICD-10-CM | POA: Diagnosis not present

## 2016-02-12 DIAGNOSIS — F329 Major depressive disorder, single episode, unspecified: Secondary | ICD-10-CM | POA: Diagnosis not present

## 2016-02-12 DIAGNOSIS — Z0000 Encounter for general adult medical examination without abnormal findings: Secondary | ICD-10-CM | POA: Diagnosis not present

## 2016-02-12 DIAGNOSIS — Z9181 History of falling: Secondary | ICD-10-CM | POA: Diagnosis not present

## 2016-02-12 DIAGNOSIS — R413 Other amnesia: Secondary | ICD-10-CM | POA: Diagnosis not present

## 2016-02-20 DIAGNOSIS — F329 Major depressive disorder, single episode, unspecified: Secondary | ICD-10-CM | POA: Diagnosis not present

## 2016-02-20 DIAGNOSIS — Z1212 Encounter for screening for malignant neoplasm of rectum: Secondary | ICD-10-CM | POA: Diagnosis not present

## 2016-02-20 DIAGNOSIS — R413 Other amnesia: Secondary | ICD-10-CM | POA: Diagnosis not present

## 2016-02-20 DIAGNOSIS — Z9181 History of falling: Secondary | ICD-10-CM | POA: Diagnosis not present

## 2016-04-09 DIAGNOSIS — G47419 Narcolepsy without cataplexy: Secondary | ICD-10-CM | POA: Diagnosis not present

## 2016-04-09 DIAGNOSIS — R413 Other amnesia: Secondary | ICD-10-CM | POA: Diagnosis not present

## 2016-04-09 DIAGNOSIS — L989 Disorder of the skin and subcutaneous tissue, unspecified: Secondary | ICD-10-CM | POA: Diagnosis not present

## 2016-04-09 DIAGNOSIS — G4710 Hypersomnia, unspecified: Secondary | ICD-10-CM | POA: Diagnosis not present

## 2016-04-09 DIAGNOSIS — Z9181 History of falling: Secondary | ICD-10-CM | POA: Diagnosis not present

## 2016-05-15 DIAGNOSIS — D485 Neoplasm of uncertain behavior of skin: Secondary | ICD-10-CM | POA: Diagnosis not present

## 2016-05-15 DIAGNOSIS — L570 Actinic keratosis: Secondary | ICD-10-CM | POA: Diagnosis not present

## 2016-05-15 DIAGNOSIS — C44622 Squamous cell carcinoma of skin of right upper limb, including shoulder: Secondary | ICD-10-CM | POA: Diagnosis not present

## 2016-06-12 DIAGNOSIS — C44622 Squamous cell carcinoma of skin of right upper limb, including shoulder: Secondary | ICD-10-CM | POA: Diagnosis not present

## 2016-07-21 DIAGNOSIS — R0902 Hypoxemia: Secondary | ICD-10-CM | POA: Diagnosis not present

## 2016-07-21 DIAGNOSIS — J189 Pneumonia, unspecified organism: Secondary | ICD-10-CM | POA: Diagnosis not present

## 2016-07-21 DIAGNOSIS — I517 Cardiomegaly: Secondary | ICD-10-CM | POA: Diagnosis not present

## 2016-07-21 DIAGNOSIS — I509 Heart failure, unspecified: Secondary | ICD-10-CM | POA: Diagnosis not present

## 2016-07-21 DIAGNOSIS — R0602 Shortness of breath: Secondary | ICD-10-CM | POA: Diagnosis not present

## 2016-07-21 DIAGNOSIS — I5189 Other ill-defined heart diseases: Secondary | ICD-10-CM | POA: Diagnosis not present

## 2016-07-21 DIAGNOSIS — J90 Pleural effusion, not elsewhere classified: Secondary | ICD-10-CM | POA: Diagnosis not present

## 2016-07-21 DIAGNOSIS — I340 Nonrheumatic mitral (valve) insufficiency: Secondary | ICD-10-CM | POA: Diagnosis not present

## 2016-07-21 DIAGNOSIS — R079 Chest pain, unspecified: Secondary | ICD-10-CM | POA: Diagnosis not present

## 2016-07-22 DIAGNOSIS — I252 Old myocardial infarction: Secondary | ICD-10-CM | POA: Diagnosis not present

## 2016-07-22 DIAGNOSIS — I5021 Acute systolic (congestive) heart failure: Secondary | ICD-10-CM | POA: Diagnosis not present

## 2016-07-22 DIAGNOSIS — J189 Pneumonia, unspecified organism: Secondary | ICD-10-CM | POA: Diagnosis not present

## 2016-07-22 DIAGNOSIS — I2510 Atherosclerotic heart disease of native coronary artery without angina pectoris: Secondary | ICD-10-CM | POA: Diagnosis not present

## 2016-07-22 DIAGNOSIS — G4733 Obstructive sleep apnea (adult) (pediatric): Secondary | ICD-10-CM | POA: Diagnosis not present

## 2016-07-22 DIAGNOSIS — F17211 Nicotine dependence, cigarettes, in remission: Secondary | ICD-10-CM | POA: Diagnosis not present

## 2016-07-22 DIAGNOSIS — R0902 Hypoxemia: Secondary | ICD-10-CM | POA: Diagnosis not present

## 2016-07-22 DIAGNOSIS — I340 Nonrheumatic mitral (valve) insufficiency: Secondary | ICD-10-CM | POA: Diagnosis not present

## 2016-07-22 DIAGNOSIS — R600 Localized edema: Secondary | ICD-10-CM | POA: Diagnosis not present

## 2016-07-22 DIAGNOSIS — J181 Lobar pneumonia, unspecified organism: Secondary | ICD-10-CM | POA: Diagnosis not present

## 2016-07-22 DIAGNOSIS — I509 Heart failure, unspecified: Secondary | ICD-10-CM | POA: Diagnosis not present

## 2016-07-23 DIAGNOSIS — I5021 Acute systolic (congestive) heart failure: Secondary | ICD-10-CM | POA: Diagnosis not present

## 2016-07-23 DIAGNOSIS — G47419 Narcolepsy without cataplexy: Secondary | ICD-10-CM | POA: Diagnosis not present

## 2016-07-23 DIAGNOSIS — I2510 Atherosclerotic heart disease of native coronary artery without angina pectoris: Secondary | ICD-10-CM | POA: Diagnosis not present

## 2016-07-23 DIAGNOSIS — J181 Lobar pneumonia, unspecified organism: Secondary | ICD-10-CM | POA: Diagnosis not present

## 2016-07-23 DIAGNOSIS — J90 Pleural effusion, not elsewhere classified: Secondary | ICD-10-CM | POA: Diagnosis not present

## 2016-07-23 DIAGNOSIS — I509 Heart failure, unspecified: Secondary | ICD-10-CM | POA: Diagnosis not present

## 2016-07-23 DIAGNOSIS — I252 Old myocardial infarction: Secondary | ICD-10-CM | POA: Diagnosis not present

## 2016-07-23 DIAGNOSIS — J189 Pneumonia, unspecified organism: Secondary | ICD-10-CM | POA: Diagnosis not present

## 2016-07-23 DIAGNOSIS — I340 Nonrheumatic mitral (valve) insufficiency: Secondary | ICD-10-CM | POA: Diagnosis not present

## 2016-07-23 DIAGNOSIS — R0902 Hypoxemia: Secondary | ICD-10-CM | POA: Diagnosis not present

## 2016-07-23 DIAGNOSIS — G4733 Obstructive sleep apnea (adult) (pediatric): Secondary | ICD-10-CM | POA: Diagnosis not present

## 2016-07-23 DIAGNOSIS — Z8673 Personal history of transient ischemic attack (TIA), and cerebral infarction without residual deficits: Secondary | ICD-10-CM | POA: Diagnosis not present

## 2016-07-24 DIAGNOSIS — R600 Localized edema: Secondary | ICD-10-CM | POA: Diagnosis not present

## 2016-07-24 DIAGNOSIS — J181 Lobar pneumonia, unspecified organism: Secondary | ICD-10-CM | POA: Diagnosis not present

## 2016-07-24 DIAGNOSIS — G4733 Obstructive sleep apnea (adult) (pediatric): Secondary | ICD-10-CM | POA: Diagnosis not present

## 2016-07-24 DIAGNOSIS — J90 Pleural effusion, not elsewhere classified: Secondary | ICD-10-CM | POA: Diagnosis not present

## 2016-07-24 DIAGNOSIS — J189 Pneumonia, unspecified organism: Secondary | ICD-10-CM | POA: Diagnosis not present

## 2016-07-24 DIAGNOSIS — I5021 Acute systolic (congestive) heart failure: Secondary | ICD-10-CM | POA: Diagnosis not present

## 2016-07-24 DIAGNOSIS — R0902 Hypoxemia: Secondary | ICD-10-CM | POA: Diagnosis not present

## 2016-07-24 DIAGNOSIS — Z8673 Personal history of transient ischemic attack (TIA), and cerebral infarction without residual deficits: Secondary | ICD-10-CM | POA: Diagnosis not present

## 2016-07-24 DIAGNOSIS — I340 Nonrheumatic mitral (valve) insufficiency: Secondary | ICD-10-CM | POA: Diagnosis not present

## 2016-07-24 DIAGNOSIS — I509 Heart failure, unspecified: Secondary | ICD-10-CM | POA: Diagnosis not present

## 2016-07-24 DIAGNOSIS — I252 Old myocardial infarction: Secondary | ICD-10-CM | POA: Diagnosis not present

## 2016-07-24 DIAGNOSIS — N179 Acute kidney failure, unspecified: Secondary | ICD-10-CM | POA: Diagnosis not present

## 2016-07-24 DIAGNOSIS — I2510 Atherosclerotic heart disease of native coronary artery without angina pectoris: Secondary | ICD-10-CM | POA: Diagnosis not present

## 2016-07-24 DIAGNOSIS — N183 Chronic kidney disease, stage 3 (moderate): Secondary | ICD-10-CM | POA: Diagnosis not present

## 2016-07-24 DIAGNOSIS — I110 Hypertensive heart disease with heart failure: Secondary | ICD-10-CM | POA: Diagnosis not present

## 2016-07-25 DIAGNOSIS — I959 Hypotension, unspecified: Secondary | ICD-10-CM | POA: Diagnosis not present

## 2016-07-25 DIAGNOSIS — R0902 Hypoxemia: Secondary | ICD-10-CM | POA: Diagnosis not present

## 2016-07-25 DIAGNOSIS — G47419 Narcolepsy without cataplexy: Secondary | ICD-10-CM | POA: Diagnosis not present

## 2016-07-25 DIAGNOSIS — J189 Pneumonia, unspecified organism: Secondary | ICD-10-CM | POA: Diagnosis not present

## 2016-07-25 DIAGNOSIS — N179 Acute kidney failure, unspecified: Secondary | ICD-10-CM | POA: Diagnosis not present

## 2016-07-25 DIAGNOSIS — I5021 Acute systolic (congestive) heart failure: Secondary | ICD-10-CM | POA: Diagnosis not present

## 2016-07-25 DIAGNOSIS — G4733 Obstructive sleep apnea (adult) (pediatric): Secondary | ICD-10-CM | POA: Diagnosis not present

## 2016-07-25 DIAGNOSIS — I361 Nonrheumatic tricuspid (valve) insufficiency: Secondary | ICD-10-CM | POA: Diagnosis not present

## 2016-07-25 DIAGNOSIS — I5189 Other ill-defined heart diseases: Secondary | ICD-10-CM | POA: Diagnosis not present

## 2016-07-25 DIAGNOSIS — I2510 Atherosclerotic heart disease of native coronary artery without angina pectoris: Secondary | ICD-10-CM | POA: Diagnosis not present

## 2016-07-25 DIAGNOSIS — N183 Chronic kidney disease, stage 3 (moderate): Secondary | ICD-10-CM | POA: Diagnosis not present

## 2016-07-25 DIAGNOSIS — R413 Other amnesia: Secondary | ICD-10-CM | POA: Diagnosis not present

## 2016-07-25 DIAGNOSIS — I509 Heart failure, unspecified: Secondary | ICD-10-CM | POA: Diagnosis not present

## 2016-07-26 DIAGNOSIS — I5189 Other ill-defined heart diseases: Secondary | ICD-10-CM | POA: Diagnosis not present

## 2016-07-26 DIAGNOSIS — I5021 Acute systolic (congestive) heart failure: Secondary | ICD-10-CM | POA: Diagnosis not present

## 2016-07-26 DIAGNOSIS — R0902 Hypoxemia: Secondary | ICD-10-CM | POA: Diagnosis not present

## 2016-07-26 DIAGNOSIS — G47419 Narcolepsy without cataplexy: Secondary | ICD-10-CM | POA: Diagnosis not present

## 2016-07-26 DIAGNOSIS — N183 Chronic kidney disease, stage 3 (moderate): Secondary | ICD-10-CM | POA: Diagnosis not present

## 2016-07-26 DIAGNOSIS — J189 Pneumonia, unspecified organism: Secondary | ICD-10-CM | POA: Diagnosis not present

## 2016-07-26 DIAGNOSIS — I959 Hypotension, unspecified: Secondary | ICD-10-CM | POA: Diagnosis not present

## 2016-07-26 DIAGNOSIS — I509 Heart failure, unspecified: Secondary | ICD-10-CM | POA: Diagnosis not present

## 2016-07-26 DIAGNOSIS — R413 Other amnesia: Secondary | ICD-10-CM | POA: Diagnosis not present

## 2016-07-26 DIAGNOSIS — I2510 Atherosclerotic heart disease of native coronary artery without angina pectoris: Secondary | ICD-10-CM | POA: Diagnosis not present

## 2016-07-26 DIAGNOSIS — N179 Acute kidney failure, unspecified: Secondary | ICD-10-CM | POA: Diagnosis not present

## 2016-07-26 DIAGNOSIS — I361 Nonrheumatic tricuspid (valve) insufficiency: Secondary | ICD-10-CM | POA: Diagnosis not present

## 2016-07-26 DIAGNOSIS — G4733 Obstructive sleep apnea (adult) (pediatric): Secondary | ICD-10-CM | POA: Diagnosis not present

## 2016-07-27 DIAGNOSIS — R0902 Hypoxemia: Secondary | ICD-10-CM | POA: Diagnosis not present

## 2016-07-27 DIAGNOSIS — G47419 Narcolepsy without cataplexy: Secondary | ICD-10-CM | POA: Diagnosis not present

## 2016-07-27 DIAGNOSIS — N183 Chronic kidney disease, stage 3 (moderate): Secondary | ICD-10-CM | POA: Diagnosis not present

## 2016-07-27 DIAGNOSIS — R413 Other amnesia: Secondary | ICD-10-CM | POA: Diagnosis not present

## 2016-07-27 DIAGNOSIS — N179 Acute kidney failure, unspecified: Secondary | ICD-10-CM | POA: Diagnosis not present

## 2016-07-27 DIAGNOSIS — I340 Nonrheumatic mitral (valve) insufficiency: Secondary | ICD-10-CM | POA: Diagnosis not present

## 2016-07-27 DIAGNOSIS — I252 Old myocardial infarction: Secondary | ICD-10-CM | POA: Diagnosis not present

## 2016-07-27 DIAGNOSIS — Z9181 History of falling: Secondary | ICD-10-CM | POA: Diagnosis not present

## 2016-07-27 DIAGNOSIS — G4733 Obstructive sleep apnea (adult) (pediatric): Secondary | ICD-10-CM | POA: Diagnosis not present

## 2016-07-27 DIAGNOSIS — J189 Pneumonia, unspecified organism: Secondary | ICD-10-CM | POA: Diagnosis not present

## 2016-07-27 DIAGNOSIS — J181 Lobar pneumonia, unspecified organism: Secondary | ICD-10-CM | POA: Diagnosis not present

## 2016-07-27 DIAGNOSIS — I5021 Acute systolic (congestive) heart failure: Secondary | ICD-10-CM | POA: Diagnosis not present

## 2016-07-27 DIAGNOSIS — I2510 Atherosclerotic heart disease of native coronary artery without angina pectoris: Secondary | ICD-10-CM | POA: Diagnosis not present

## 2016-07-27 DIAGNOSIS — I129 Hypertensive chronic kidney disease with stage 1 through stage 4 chronic kidney disease, or unspecified chronic kidney disease: Secondary | ICD-10-CM | POA: Diagnosis not present

## 2016-07-27 DIAGNOSIS — I509 Heart failure, unspecified: Secondary | ICD-10-CM | POA: Diagnosis not present

## 2016-07-27 DIAGNOSIS — R600 Localized edema: Secondary | ICD-10-CM | POA: Diagnosis not present

## 2016-07-28 ENCOUNTER — Other Ambulatory Visit (HOSPITAL_COMMUNITY): Payer: Medicare Other

## 2016-07-28 ENCOUNTER — Inpatient Hospital Stay
Admission: RE | Admit: 2016-07-28 | Discharge: 2016-08-25 | Disposition: E | Payer: Medicare Other | Source: Ambulatory Visit | Attending: Internal Medicine | Admitting: Internal Medicine

## 2016-07-28 DIAGNOSIS — Z6821 Body mass index (BMI) 21.0-21.9, adult: Secondary | ICD-10-CM | POA: Diagnosis not present

## 2016-07-28 DIAGNOSIS — R4182 Altered mental status, unspecified: Secondary | ICD-10-CM | POA: Diagnosis not present

## 2016-07-28 DIAGNOSIS — Z9911 Dependence on respirator [ventilator] status: Secondary | ICD-10-CM | POA: Diagnosis not present

## 2016-07-28 DIAGNOSIS — J9690 Respiratory failure, unspecified, unspecified whether with hypoxia or hypercapnia: Secondary | ICD-10-CM | POA: Diagnosis not present

## 2016-07-28 DIAGNOSIS — E46 Unspecified protein-calorie malnutrition: Secondary | ICD-10-CM | POA: Diagnosis present

## 2016-07-28 DIAGNOSIS — R188 Other ascites: Secondary | ICD-10-CM

## 2016-07-28 DIAGNOSIS — R339 Retention of urine, unspecified: Secondary | ICD-10-CM | POA: Diagnosis present

## 2016-07-28 DIAGNOSIS — I110 Hypertensive heart disease with heart failure: Secondary | ICD-10-CM | POA: Diagnosis not present

## 2016-07-28 DIAGNOSIS — Z66 Do not resuscitate: Secondary | ICD-10-CM | POA: Diagnosis present

## 2016-07-28 DIAGNOSIS — F329 Major depressive disorder, single episode, unspecified: Secondary | ICD-10-CM | POA: Diagnosis present

## 2016-07-28 DIAGNOSIS — I5022 Chronic systolic (congestive) heart failure: Secondary | ICD-10-CM | POA: Diagnosis not present

## 2016-07-28 DIAGNOSIS — I509 Heart failure, unspecified: Secondary | ICD-10-CM | POA: Diagnosis not present

## 2016-07-28 DIAGNOSIS — M6281 Muscle weakness (generalized): Secondary | ICD-10-CM | POA: Diagnosis present

## 2016-07-28 DIAGNOSIS — I129 Hypertensive chronic kidney disease with stage 1 through stage 4 chronic kidney disease, or unspecified chronic kidney disease: Secondary | ICD-10-CM | POA: Diagnosis not present

## 2016-07-28 DIAGNOSIS — F0390 Unspecified dementia without behavioral disturbance: Secondary | ICD-10-CM | POA: Diagnosis present

## 2016-07-28 DIAGNOSIS — G47419 Narcolepsy without cataplexy: Secondary | ICD-10-CM | POA: Diagnosis not present

## 2016-07-28 DIAGNOSIS — J811 Chronic pulmonary edema: Secondary | ICD-10-CM | POA: Diagnosis not present

## 2016-07-28 DIAGNOSIS — I2510 Atherosclerotic heart disease of native coronary artery without angina pectoris: Secondary | ICD-10-CM | POA: Diagnosis not present

## 2016-07-28 DIAGNOSIS — Z452 Encounter for adjustment and management of vascular access device: Secondary | ICD-10-CM

## 2016-07-28 DIAGNOSIS — J189 Pneumonia, unspecified organism: Secondary | ICD-10-CM

## 2016-07-28 DIAGNOSIS — Z9889 Other specified postprocedural states: Secondary | ICD-10-CM

## 2016-07-28 DIAGNOSIS — J8410 Pulmonary fibrosis, unspecified: Secondary | ICD-10-CM | POA: Diagnosis not present

## 2016-07-28 DIAGNOSIS — N178 Other acute kidney failure: Secondary | ICD-10-CM | POA: Diagnosis not present

## 2016-07-28 DIAGNOSIS — G4733 Obstructive sleep apnea (adult) (pediatric): Secondary | ICD-10-CM | POA: Diagnosis present

## 2016-07-28 DIAGNOSIS — I5021 Acute systolic (congestive) heart failure: Secondary | ICD-10-CM | POA: Diagnosis not present

## 2016-07-28 DIAGNOSIS — J918 Pleural effusion in other conditions classified elsewhere: Secondary | ICD-10-CM | POA: Diagnosis present

## 2016-07-28 DIAGNOSIS — J9621 Acute and chronic respiratory failure with hypoxia: Secondary | ICD-10-CM | POA: Diagnosis not present

## 2016-07-28 DIAGNOSIS — I130 Hypertensive heart and chronic kidney disease with heart failure and stage 1 through stage 4 chronic kidney disease, or unspecified chronic kidney disease: Secondary | ICD-10-CM | POA: Diagnosis not present

## 2016-07-28 DIAGNOSIS — J90 Pleural effusion, not elsewhere classified: Secondary | ICD-10-CM | POA: Diagnosis not present

## 2016-07-28 DIAGNOSIS — J9600 Acute respiratory failure, unspecified whether with hypoxia or hypercapnia: Secondary | ICD-10-CM | POA: Diagnosis not present

## 2016-07-28 DIAGNOSIS — R0602 Shortness of breath: Secondary | ICD-10-CM | POA: Diagnosis not present

## 2016-07-28 DIAGNOSIS — J80 Acute respiratory distress syndrome: Secondary | ICD-10-CM | POA: Diagnosis not present

## 2016-07-28 DIAGNOSIS — N179 Acute kidney failure, unspecified: Secondary | ICD-10-CM | POA: Diagnosis not present

## 2016-07-28 DIAGNOSIS — J9691 Respiratory failure, unspecified with hypoxia: Secondary | ICD-10-CM | POA: Diagnosis not present

## 2016-07-28 DIAGNOSIS — N183 Chronic kidney disease, stage 3 (moderate): Secondary | ICD-10-CM | POA: Diagnosis present

## 2016-07-28 DIAGNOSIS — Z515 Encounter for palliative care: Secondary | ICD-10-CM | POA: Diagnosis not present

## 2016-07-28 DIAGNOSIS — J439 Emphysema, unspecified: Secondary | ICD-10-CM | POA: Diagnosis not present

## 2016-07-28 LAB — TSH: TSH: 3.508 u[IU]/mL (ref 0.350–4.500)

## 2016-07-29 ENCOUNTER — Other Ambulatory Visit (HOSPITAL_COMMUNITY): Payer: Medicare Other

## 2016-07-29 DIAGNOSIS — J90 Pleural effusion, not elsewhere classified: Secondary | ICD-10-CM | POA: Diagnosis not present

## 2016-07-29 DIAGNOSIS — J439 Emphysema, unspecified: Secondary | ICD-10-CM | POA: Diagnosis not present

## 2016-07-29 DIAGNOSIS — J811 Chronic pulmonary edema: Secondary | ICD-10-CM | POA: Diagnosis not present

## 2016-07-29 LAB — CBC
HEMATOCRIT: 39 % (ref 39.0–52.0)
Hemoglobin: 13 g/dL (ref 13.0–17.0)
MCH: 31.8 pg (ref 26.0–34.0)
MCHC: 33.3 g/dL (ref 30.0–36.0)
MCV: 95.4 fL (ref 78.0–100.0)
PLATELETS: 410 10*3/uL — AB (ref 150–400)
RBC: 4.09 MIL/uL — ABNORMAL LOW (ref 4.22–5.81)
RDW: 12.5 % (ref 11.5–15.5)
WBC: 18.1 10*3/uL — AB (ref 4.0–10.5)

## 2016-07-29 LAB — BASIC METABOLIC PANEL
ANION GAP: 10 (ref 5–15)
BUN: 37 mg/dL — AB (ref 6–20)
CALCIUM: 9 mg/dL (ref 8.9–10.3)
CO2: 24 mmol/L (ref 22–32)
CREATININE: 1.55 mg/dL — AB (ref 0.61–1.24)
Chloride: 101 mmol/L (ref 101–111)
GFR calc Af Amer: 45 mL/min — ABNORMAL LOW (ref >60)
GFR, EST NON AFRICAN AMERICAN: 39 mL/min — AB (ref >60)
GLUCOSE: 114 mg/dL — AB (ref 65–99)
Potassium: 4.2 mmol/L (ref 3.5–5.1)
Sodium: 135 mmol/L (ref 135–145)

## 2016-07-30 LAB — BASIC METABOLIC PANEL
ANION GAP: 10 (ref 5–15)
BUN: 40 mg/dL — AB (ref 6–20)
CO2: 27 mmol/L (ref 22–32)
Calcium: 9.1 mg/dL (ref 8.9–10.3)
Chloride: 102 mmol/L (ref 101–111)
Creatinine, Ser: 1.7 mg/dL — ABNORMAL HIGH (ref 0.61–1.24)
GFR calc Af Amer: 40 mL/min — ABNORMAL LOW (ref >60)
GFR, EST NON AFRICAN AMERICAN: 35 mL/min — AB (ref >60)
Glucose, Bld: 109 mg/dL — ABNORMAL HIGH (ref 65–99)
POTASSIUM: 3.9 mmol/L (ref 3.5–5.1)
SODIUM: 139 mmol/L (ref 135–145)

## 2016-07-31 ENCOUNTER — Other Ambulatory Visit (HOSPITAL_COMMUNITY): Payer: Medicare Other

## 2016-07-31 ENCOUNTER — Encounter (HOSPITAL_COMMUNITY): Payer: Self-pay | Admitting: Student

## 2016-07-31 DIAGNOSIS — R0602 Shortness of breath: Secondary | ICD-10-CM | POA: Diagnosis not present

## 2016-07-31 DIAGNOSIS — J90 Pleural effusion, not elsewhere classified: Secondary | ICD-10-CM | POA: Diagnosis not present

## 2016-07-31 DIAGNOSIS — J811 Chronic pulmonary edema: Secondary | ICD-10-CM | POA: Diagnosis not present

## 2016-07-31 DIAGNOSIS — J439 Emphysema, unspecified: Secondary | ICD-10-CM | POA: Diagnosis not present

## 2016-07-31 HISTORY — PX: IR THORACENTESIS ASP PLEURAL SPACE W/IMG GUIDE: IMG5380

## 2016-07-31 LAB — BRAIN NATRIURETIC PEPTIDE: B Natriuretic Peptide: 972.3 pg/mL — ABNORMAL HIGH (ref 0.0–100.0)

## 2016-07-31 MED ORDER — LIDOCAINE HCL 1 % IJ SOLN
INTRAMUSCULAR | Status: AC
  Filled 2016-07-31: qty 20

## 2016-07-31 MED ORDER — LIDOCAINE HCL (PF) 1 % IJ SOLN
INTRAMUSCULAR | Status: DC | PRN
  Administered 2016-07-31: 10 mL

## 2016-07-31 NOTE — Procedures (Signed)
PROCEDURE SUMMARY:  Successful US guided therapeutic left thoracentesis. Yielded 661mL of clear, yellow fluid. Pt tolerated procedure well. No immediate complications.  Specimen was not sent for labs. CXR ordered.  Docia Barrier PA-C 07/31/2016 3:58 PM

## 2016-08-01 LAB — CBC
HCT: 37 % — ABNORMAL LOW (ref 39.0–52.0)
HEMOGLOBIN: 12.3 g/dL — AB (ref 13.0–17.0)
MCH: 31.7 pg (ref 26.0–34.0)
MCHC: 33.2 g/dL (ref 30.0–36.0)
MCV: 95.4 fL (ref 78.0–100.0)
PLATELETS: 505 10*3/uL — AB (ref 150–400)
RBC: 3.88 MIL/uL — AB (ref 4.22–5.81)
RDW: 12.8 % (ref 11.5–15.5)
WBC: 21.7 10*3/uL — ABNORMAL HIGH (ref 4.0–10.5)

## 2016-08-01 LAB — BASIC METABOLIC PANEL
Anion gap: 11 (ref 5–15)
BUN: 54 mg/dL — AB (ref 6–20)
CALCIUM: 9 mg/dL (ref 8.9–10.3)
CO2: 28 mmol/L (ref 22–32)
Chloride: 102 mmol/L (ref 101–111)
Creatinine, Ser: 1.83 mg/dL — ABNORMAL HIGH (ref 0.61–1.24)
GFR calc non Af Amer: 32 mL/min — ABNORMAL LOW (ref >60)
GFR, EST AFRICAN AMERICAN: 37 mL/min — AB (ref >60)
GLUCOSE: 132 mg/dL — AB (ref 65–99)
Potassium: 3.6 mmol/L (ref 3.5–5.1)
SODIUM: 141 mmol/L (ref 135–145)

## 2016-08-02 LAB — CULTURE, RESPIRATORY

## 2016-08-02 LAB — VANCOMYCIN, RANDOM

## 2016-08-03 LAB — VANCOMYCIN, RANDOM: VANCOMYCIN RM: 10

## 2016-08-04 ENCOUNTER — Other Ambulatory Visit (HOSPITAL_COMMUNITY): Payer: Medicare Other

## 2016-08-04 DIAGNOSIS — Z452 Encounter for adjustment and management of vascular access device: Secondary | ICD-10-CM | POA: Diagnosis not present

## 2016-08-05 ENCOUNTER — Other Ambulatory Visit (HOSPITAL_COMMUNITY): Payer: Medicare Other

## 2016-08-05 LAB — CULTURE, BLOOD (ROUTINE X 2)
CULTURE: NO GROWTH
Culture: NO GROWTH
Special Requests: ADEQUATE
Special Requests: ADEQUATE

## 2016-08-05 LAB — DIGOXIN LEVEL: Digoxin Level: 0.3 ng/mL — ABNORMAL LOW (ref 0.8–2.0)

## 2016-08-06 ENCOUNTER — Other Ambulatory Visit (HOSPITAL_BASED_OUTPATIENT_CLINIC_OR_DEPARTMENT_OTHER): Payer: Medicare Other

## 2016-08-06 DIAGNOSIS — J80 Acute respiratory distress syndrome: Secondary | ICD-10-CM

## 2016-08-06 LAB — BASIC METABOLIC PANEL
ANION GAP: 11 (ref 5–15)
ANION GAP: 9 (ref 5–15)
BUN: 75 mg/dL — AB (ref 6–20)
BUN: 75 mg/dL — ABNORMAL HIGH (ref 6–20)
CALCIUM: 9.1 mg/dL (ref 8.9–10.3)
CHLORIDE: 116 mmol/L — AB (ref 101–111)
CO2: 34 mmol/L — AB (ref 22–32)
CO2: 35 mmol/L — AB (ref 22–32)
CREATININE: 2.25 mg/dL — AB (ref 0.61–1.24)
Calcium: 8.9 mg/dL (ref 8.9–10.3)
Chloride: 116 mmol/L — ABNORMAL HIGH (ref 101–111)
Creatinine, Ser: 2.25 mg/dL — ABNORMAL HIGH (ref 0.61–1.24)
GFR calc Af Amer: 29 mL/min — ABNORMAL LOW (ref >60)
GFR calc Af Amer: 29 mL/min — ABNORMAL LOW (ref >60)
GFR calc non Af Amer: 25 mL/min — ABNORMAL LOW (ref >60)
GFR, EST NON AFRICAN AMERICAN: 25 mL/min — AB (ref >60)
GLUCOSE: 117 mg/dL — AB (ref 65–99)
GLUCOSE: 116 mg/dL — AB (ref 65–99)
POTASSIUM: 3.4 mmol/L — AB (ref 3.5–5.1)
Potassium: 3.4 mmol/L — ABNORMAL LOW (ref 3.5–5.1)
Sodium: 161 mmol/L (ref 135–145)
Sodium: 160 mmol/L — ABNORMAL HIGH (ref 135–145)

## 2016-08-06 LAB — CBC
HEMATOCRIT: 41.9 % (ref 39.0–52.0)
Hemoglobin: 13.2 g/dL (ref 13.0–17.0)
MCH: 31.4 pg (ref 26.0–34.0)
MCHC: 31.5 g/dL (ref 30.0–36.0)
MCV: 99.8 fL (ref 78.0–100.0)
PLATELETS: 590 10*3/uL — AB (ref 150–400)
RBC: 4.2 MIL/uL — ABNORMAL LOW (ref 4.22–5.81)
RDW: 13.6 % (ref 11.5–15.5)
WBC: 21.1 10*3/uL — ABNORMAL HIGH (ref 4.0–10.5)

## 2016-08-07 LAB — CULTURE, BLOOD (ROUTINE X 2)
CULTURE: NO GROWTH
Culture: NO GROWTH
Special Requests: ADEQUATE
Special Requests: ADEQUATE

## 2016-08-07 LAB — CBC
HCT: 44.1 % (ref 39.0–52.0)
Hemoglobin: 13.5 g/dL (ref 13.0–17.0)
MCH: 31.1 pg (ref 26.0–34.0)
MCHC: 30.6 g/dL (ref 30.0–36.0)
MCV: 101.6 fL — AB (ref 78.0–100.0)
PLATELETS: 541 10*3/uL — AB (ref 150–400)
RBC: 4.34 MIL/uL (ref 4.22–5.81)
RDW: 13.7 % (ref 11.5–15.5)
WBC: 23.8 10*3/uL — ABNORMAL HIGH (ref 4.0–10.5)

## 2016-08-07 LAB — BASIC METABOLIC PANEL
Anion gap: 11 (ref 5–15)
BUN: 77 mg/dL — ABNORMAL HIGH (ref 6–20)
CALCIUM: 9.1 mg/dL (ref 8.9–10.3)
CHLORIDE: 122 mmol/L — AB (ref 101–111)
CO2: 34 mmol/L — AB (ref 22–32)
CREATININE: 2.4 mg/dL — AB (ref 0.61–1.24)
GFR calc Af Amer: 27 mL/min — ABNORMAL LOW (ref >60)
GFR calc non Af Amer: 23 mL/min — ABNORMAL LOW (ref >60)
Glucose, Bld: 121 mg/dL — ABNORMAL HIGH (ref 65–99)
Potassium: 3.8 mmol/L (ref 3.5–5.1)
Sodium: 167 mmol/L (ref 135–145)

## 2016-08-12 MED FILL — Medication: Qty: 1 | Status: AC

## 2016-08-25 DEATH — deceased
